# Patient Record
Sex: Female | Born: 1935 | Race: White | Hispanic: No | Smoking: Never smoker
Health system: Southern US, Community
[De-identification: ages and names within clinical notes are randomized; demographics above are authoritative.]

## PROBLEM LIST (undated history)

## (undated) DIAGNOSIS — E039 Hypothyroidism, unspecified: Secondary | ICD-10-CM

## (undated) DIAGNOSIS — Z950 Presence of cardiac pacemaker: Secondary | ICD-10-CM

## (undated) DIAGNOSIS — I2510 Atherosclerotic heart disease of native coronary artery without angina pectoris: Secondary | ICD-10-CM

## (undated) DIAGNOSIS — G4700 Insomnia, unspecified: Secondary | ICD-10-CM

## (undated) DIAGNOSIS — E079 Disorder of thyroid, unspecified: Secondary | ICD-10-CM

## (undated) DIAGNOSIS — I10 Essential (primary) hypertension: Secondary | ICD-10-CM

## (undated) HISTORY — PX: ABDOMINAL HYSTERECTOMY: SHX81

## (undated) HISTORY — PX: OTHER SURGICAL HISTORY: SHX169

## (undated) HISTORY — DX: Insomnia, unspecified: G47.00

---

## 2013-11-15 ENCOUNTER — Encounter (HOSPITAL_BASED_OUTPATIENT_CLINIC_OR_DEPARTMENT_OTHER): Payer: Self-pay | Admitting: *Deleted

## 2013-11-15 ENCOUNTER — Emergency Department (HOSPITAL_BASED_OUTPATIENT_CLINIC_OR_DEPARTMENT_OTHER)
Admission: EM | Admit: 2013-11-15 | Discharge: 2013-11-15 | Disposition: A | Discharge status: Home / Self Care | Payer: Medicare Other | Attending: Emergency Medicine | Admitting: Emergency Medicine

## 2013-11-15 DIAGNOSIS — Z8781 Personal history of (healed) traumatic fracture: Secondary | ICD-10-CM | POA: Insufficient documentation

## 2013-11-15 DIAGNOSIS — E079 Disorder of thyroid, unspecified: Secondary | ICD-10-CM | POA: Diagnosis not present

## 2013-11-15 DIAGNOSIS — I2510 Atherosclerotic heart disease of native coronary artery without angina pectoris: Secondary | ICD-10-CM | POA: Insufficient documentation

## 2013-11-15 DIAGNOSIS — Z950 Presence of cardiac pacemaker: Secondary | ICD-10-CM | POA: Diagnosis not present

## 2013-11-15 DIAGNOSIS — S29012D Strain of muscle and tendon of back wall of thorax, subsequent encounter: Secondary | ICD-10-CM | POA: Diagnosis not present

## 2013-11-15 DIAGNOSIS — S199XXD Unspecified injury of neck, subsequent encounter: Secondary | ICD-10-CM | POA: Diagnosis present

## 2013-11-15 DIAGNOSIS — I10 Essential (primary) hypertension: Secondary | ICD-10-CM | POA: Insufficient documentation

## 2013-11-15 DIAGNOSIS — Z7901 Long term (current) use of anticoagulants: Secondary | ICD-10-CM | POA: Insufficient documentation

## 2013-11-15 DIAGNOSIS — Z79899 Other long term (current) drug therapy: Secondary | ICD-10-CM | POA: Insufficient documentation

## 2013-11-15 DIAGNOSIS — Z7951 Long term (current) use of inhaled steroids: Secondary | ICD-10-CM | POA: Insufficient documentation

## 2013-11-15 DIAGNOSIS — S46819D Strain of other muscles, fascia and tendons at shoulder and upper arm level, unspecified arm, subsequent encounter: Secondary | ICD-10-CM

## 2013-11-15 HISTORY — DX: Atherosclerotic heart disease of native coronary artery without angina pectoris: I25.10

## 2013-11-15 HISTORY — DX: Disorder of thyroid, unspecified: E07.9

## 2013-11-15 HISTORY — DX: Essential (primary) hypertension: I10

## 2013-11-15 MED ORDER — ORPHENADRINE CITRATE ER 100 MG PO TB12: 100.0000 mg | ORAL_TABLET | Freq: Two times a day (BID) | ORAL | Status: DC | PRN
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
## 2013-11-15 NOTE — Discharge Instructions (Signed)
Use heat, gradual increase stretching and follow-up with physical therapy for further evaluation and treatment of your neck and upper back musculature specifically trapezius muscles.  If you were given medicines take as directed.  If you are on coumadin or contraceptives realize their levels and effectiveness is altered by many different medicines.  If you have any reaction (rash, tongues swelling, other) to the medicines stop taking and see a physician.   Please follow up as directed and return to the ER or see a physician for new or worsening symptoms.  Thank you. Filed Vitals:   11/15/13 1644  BP: 153/55  Pulse: 84  Temp: 97.9 F (36.6 C)  TempSrc: Oral  Resp: 18  Height: 5\' 2"  (1.575 m)  Weight: 125 lb (56.7 kg)  SpO2: 100%

## 2013-11-15 NOTE — ED Notes (Signed)
MD at bedside. 

## 2013-11-15 NOTE — ED Notes (Signed)
Directed to pharmacy to pick up Rx- referral given for PT
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
## 2015-01-18 DIAGNOSIS — Z95810 Presence of automatic (implantable) cardiac defibrillator: Secondary | ICD-10-CM | POA: Insufficient documentation

## 2015-09-27 ENCOUNTER — Emergency Department (HOSPITAL_BASED_OUTPATIENT_CLINIC_OR_DEPARTMENT_OTHER)
Admission: EM | Admit: 2015-09-27 | Discharge: 2015-09-28 | Disposition: A | Discharge status: Home / Self Care | Payer: Medicare Other | Attending: Emergency Medicine | Admitting: Emergency Medicine

## 2015-09-27 ENCOUNTER — Emergency Department (HOSPITAL_BASED_OUTPATIENT_CLINIC_OR_DEPARTMENT_OTHER): Payer: Medicare Other

## 2015-09-27 ENCOUNTER — Encounter (HOSPITAL_BASED_OUTPATIENT_CLINIC_OR_DEPARTMENT_OTHER): Payer: Self-pay | Admitting: Emergency Medicine

## 2015-09-27 DIAGNOSIS — M542 Cervicalgia: Secondary | ICD-10-CM | POA: Diagnosis not present

## 2015-09-27 DIAGNOSIS — Z79899 Other long term (current) drug therapy: Secondary | ICD-10-CM | POA: Diagnosis not present

## 2015-09-27 DIAGNOSIS — I2510 Atherosclerotic heart disease of native coronary artery without angina pectoris: Secondary | ICD-10-CM | POA: Insufficient documentation

## 2015-09-27 DIAGNOSIS — M5442 Lumbago with sciatica, left side: Secondary | ICD-10-CM | POA: Insufficient documentation

## 2015-09-27 DIAGNOSIS — I10 Essential (primary) hypertension: Secondary | ICD-10-CM | POA: Insufficient documentation

## 2015-09-27 DIAGNOSIS — M545 Low back pain: Secondary | ICD-10-CM | POA: Diagnosis present

## 2015-09-27 DIAGNOSIS — W19XXXA Unspecified fall, initial encounter: Secondary | ICD-10-CM

## 2015-09-27 MED ORDER — ACETAMINOPHEN 325 MG PO TABS
650.0000 mg | ORAL_TABLET | Freq: Once | ORAL | Status: AC
  Administered 2015-09-27: 650 mg via ORAL
  Filled 2015-09-27: qty 2

## 2015-09-27 NOTE — ED Notes (Signed)
Pt to xray by w/c.

## 2015-09-27 NOTE — ED Notes (Addendum)
Pt alert, NAD, calm, interactive, resps e/u, speaking in clear complete sentences, no dyspnea noted, MAEx4,  reports back and neck pain. [facility]

## 2015-09-27 NOTE — ED Provider Notes (Signed)
[facility] Provider Note   CSN: [id number] Arrival date & time: 09/27/15  1931  By signing my name below, I, Freida Busman, attest that this documentation has been prepared under the direction and in the presence of non-physician practitioner, Everlene Farrier, PA-C. Electronically Signed: Freida Busman, Scribe. 09/27/2015. 1:38 AM.  History   Chief Complaint Chief Complaint  Patient presents with  . Fall   The history is provided by the patient. No language interpreter was used.   HPI Comments:  Patricia Bond is a 81 y.o. female who presents to the Emergency Department s/p fall 1 week ago complaining of gradually worsening lower back pain following the incident. She feels her pain radiates into her left hip. She rates her pain a 5/10. Pt sat on the edge of a chair that moved; she fell backwards and landed on tile ground. Pt struck the back of her head on the chair but denies LOC. She reports associated right lateral neck pain. She denies Fevers, numbness, tingling, weakness, double vision, ear discharge, HA, CP, and SOB.  Pt is currently on Eliquis. Pt notes she has hardware in the right hip.   Past Medical History:  Diagnosis Date  . Coronary artery disease   . Hypertension   . Thyroid disease     There are no active problems to display for this patient.   Past Surgical History:  Procedure Laterality Date  . ABDOMINAL HYSTERECTOMY    . cardiac ablasion    . pace maker      OB History    No data available       Home Medications    Prior to Admission medications   Medication Sig Start Date End Date Taking? Authorizing Provider  apixaban (ELIQUIS) 5 MG TABS tablet Take 2.5 mg by mouth 2 (two) times daily.    Yes Historical Provider, MD  acetaminophen (TYLENOL) 500 MG tablet Take 1 tablet (500 mg total) by mouth every 6 (six) hours as needed for mild pain or moderate pain. 09/28/15   Everlene Farrier, PA-C  carvedilol (COREG) 6.25 MG tablet Take 6.25 mg by mouth  2 (two) times daily with a meal.    Historical Provider, MD  DULoxetine (CYMBALTA) 30 MG capsule Take 30 mg by mouth daily.    Historical Provider, MD  fluticasone (VERAMYST) 27.5 MCG/SPRAY nasal spray Place 2 sprays into the nose daily.    Historical Provider, MD  Levothyroxine Sodium (SYNTHROID PO) Take by mouth.    Historical Provider, MD  lisinopril (PRINIVIL,ZESTRIL) 5 MG tablet Take 5 mg by mouth daily.    Historical Provider, MD  LORAZEPAM PO Take by mouth.    Historical Provider, MD  methocarbamol (ROBAXIN) 500 MG tablet Take 1 tablet (500 mg total) by mouth 2 (two) times daily as needed for muscle spasms. 09/28/15   Everlene Farrier, PA-C  omeprazole (PRILOSEC) 20 MG capsule Take 20 mg by mouth daily.    Historical Provider, MD  orphenadrine (NORFLEX) 100 MG tablet Take 1 tablet (100 mg total) by mouth 2 (two) times daily as needed for muscle spasms. 11/15/13   Blane Ohara, MD  ranolazine (RANEXA) 500 MG 12 hr tablet Take 500 mg by mouth 2 (two) times daily.    Historical Provider, MD  rosuvastatin (CRESTOR) 40 MG tablet Take 40 mg by mouth daily.    Historical Provider, MD  warfarin (COUMADIN) 2.5 MG tablet Take 2.5 mg by mouth daily.    Historical Provider, MD    Family History History  reviewed. No pertinent family history.  Social History Social History  Substance Use Topics  . Smoking status: Never Smoker  . Smokeless tobacco: Never Used  . Alcohol use No     Allergies   Ivp dye [iodinated diagnostic agents] and Sulfa antibiotics   Review of Systems Review of Systems  Constitutional: Negative for fever.  HENT: Negative for ear discharge.   Eyes: Negative for visual disturbance.  Respiratory: Negative for shortness of breath.   Cardiovascular: Negative for chest pain.  Gastrointestinal: Negative for abdominal pain, nausea and vomiting.  Genitourinary: Negative for difficulty urinating and dysuria.  Musculoskeletal: Positive for back pain and neck pain.  Skin:  Negative for rash and wound.  Neurological: Negative for dizziness, syncope, weakness, light-headedness, numbness and headaches.    Physical Exam Updated Vital Signs BP 137/61 (BP Location: Right Arm)   Pulse 78   Temp 98.8 F (37.1 C) (Oral)   Resp 18   Ht 5\' 2"  (1.575 m)   Wt 62.6 kg   SpO2 100%   BMI 25.24 kg/m   Physical Exam  Constitutional: She is oriented to person, place, and time. She appears well-developed and well-nourished. No distress.  Non-toxic appearing  HENT:  Head: Normocephalic and atraumatic.  Right Ear: External ear normal.  Left Ear: External ear normal.  Mouth/Throat: Oropharynx is clear and moist.  No visble signs of head trauma   Eyes: Conjunctivae and EOM are normal. Pupils are equal, round, and reactive to light. Right eye exhibits no discharge. Left eye exhibits no discharge.  Neck: Normal range of motion. Neck supple. No JVD present. No tracheal deviation present.  Right lateral neck tenderness; No midline cervical tenderness.  No crepitus or deformity  Cardiovascular: Normal rate, regular rhythm, normal heart sounds and intact distal pulses.   Pulses:      Radial pulses are 2+ on the right side, and 2+ on the left side.       Dorsalis pedis pulses are 2+ on the right side, and 2+ on the left side.  Pulmonary/Chest: Effort normal and breath sounds normal. No stridor. No respiratory distress. She has no wheezes. She has no rales. She exhibits no tenderness.  Lungs CTA bilaterally Symmetric chest expansion bilaterally  Abdominal: Soft. There is no tenderness. There is no guarding.  Musculoskeletal: Normal range of motion. She exhibits tenderness. She exhibits no edema or deformity.  Tenderness across bilateral low back; no midline back tenderness Good strength to BUE and BLE  Mild TTP to left lateral hip No midline neck or back tenderness. No back erythema, deformity, ecchymosis or warmth.  Lymphadenopathy:    She has no cervical adenopathy.    Neurological: She is alert and oriented to person, place, and time. No cranial nerve deficit. Coordination normal.  Cranial nerves intact  Speech clear and coherent Sensation intact to BLE and BUE  Skin: Skin is warm and dry. Capillary refill takes less than 2 seconds. No rash noted. She is not diaphoretic.  Psychiatric: She has a normal mood and affect. Her behavior is normal.  Nursing note and vitals reviewed.    ED Treatments / Results  DIAGNOSTIC STUDIES:  Oxygen Saturation is 100% on RA, normal by my interpretation.    COORDINATION OF CARE:  9:37 PM Discussed treatment plan with pt at bedside and pt agreed to plan.  Labs (all labs ordered are listed, but only abnormal results are displayed) Labs Reviewed - No data to display  EKG  EKG Interpretation None  Radiology No results found.  Procedures Procedures (including critical care time)  Medications Ordered in ED Medications  acetaminophen (TYLENOL) tablet 650 mg (650 mg Oral Given 09/27/15 2156)     Initial Impression / Assessment and Plan / ED Course  I have reviewed the triage vital signs and the nursing notes.  Pertinent labs & imaging results that were available during my care of the patient were reviewed by me and considered in my medical decision making (see chart for details).  Clinical Course   Patient presented to the emergency department after a slip and fall out of her chair one week ago. She is complaining of bilateral low back pain that can radiate to her left hip as well as right lateral neck pain. She denies loss of consciousness. She denies headache. She reports pain worse with walking. On exam the patient is afebrile nontoxic appearing. She has no focal neurological deficits. She has good strength to her bilateral upper and lower extremities. No midline neck or back tenderness. She does have tenderness across her bilateral lumbar spine. She is has mild tenderness to her left lateral  hip. X-rays were obtained of her cervical spine, lumbar spine and left hip with pelvis. There is some difficulty obtaining the radiology report on these results due to unexpected radiology downtime. I called and spoke with the radiologist who read the images. No evidence of acute fracture or dislocation on her films. She has degenerative changes. Her hip hardware is intact. Her left hip. I discussed these results with the patient. Patient is then able to ambulate in the room with minimal pain. This is reassuring. We will have her continue Tylenol as needed for pain and do a short course of Robaxin. I advised that she needs to be careful with her Robaxin as this can make her drowsy and loopy. Her daughter will be staying with her and will help her. I advised the patient to follow-up with their primary care provider this week. I advised the patient to return to the emergency department with new or worsening symptoms or new concerns. The patient verbalized understanding and agreement with plan.    Final Clinical Impressions(s) / ED Diagnoses   Final diagnoses:  Fall, initial encounter  Neck pain  Left-sided low back pain with left-sided sciatica    New Prescriptions New Prescriptions   ACETAMINOPHEN (TYLENOL) 500 MG TABLET    Take 1 tablet (500 mg total) by mouth every 6 (six) hours as needed for mild pain or moderate pain.   METHOCARBAMOL (ROBAXIN) 500 MG TABLET    Take 1 tablet (500 mg total) by mouth 2 (two) times daily as needed for muscle spasms.   I personally performed the services described in this documentation, which was scribed in my presence. The recorded information has been reviewed and is accurate.      Everlene FarrierWilliam Shakya Sebring, PA-C 09/28/15 [id number]    Benjiman CoreNathan Pickering, MD 09/29/15 [phone]

## 2015-09-27 NOTE — ED Notes (Signed)
Sitting in w/c for comfort, given pillow, and sips of water, pending xray results, NAD, calm.

## 2015-09-27 NOTE — ED Triage Notes (Signed)
Patient states that she fell about 1 week and her lower back is still bother her and her neck

## 2015-09-28 MED ORDER — ACETAMINOPHEN 500 MG PO TABS: 500.0000 mg | ORAL_TABLET | Freq: Four times a day (QID) | ORAL | 0 refills | Status: DC | PRN

## 2015-09-28 MED ORDER — METHOCARBAMOL 500 MG PO TABS: 500.0000 mg | ORAL_TABLET | Freq: Two times a day (BID) | ORAL | 0 refills | Status: DC | PRN

## 2015-12-26 ENCOUNTER — Emergency Department (HOSPITAL_BASED_OUTPATIENT_CLINIC_OR_DEPARTMENT_OTHER): Payer: Medicare Other

## 2015-12-26 ENCOUNTER — Emergency Department (HOSPITAL_BASED_OUTPATIENT_CLINIC_OR_DEPARTMENT_OTHER)
Admission: EM | Admit: 2015-12-26 | Discharge: 2015-12-26 | Disposition: A | Discharge status: Home / Self Care | Payer: Medicare Other | Attending: Physician Assistant | Admitting: Physician Assistant

## 2015-12-26 ENCOUNTER — Encounter (HOSPITAL_BASED_OUTPATIENT_CLINIC_OR_DEPARTMENT_OTHER): Payer: Self-pay | Admitting: Emergency Medicine

## 2015-12-26 DIAGNOSIS — E039 Hypothyroidism, unspecified: Secondary | ICD-10-CM | POA: Insufficient documentation

## 2015-12-26 DIAGNOSIS — R1031 Right lower quadrant pain: Secondary | ICD-10-CM | POA: Diagnosis present

## 2015-12-26 DIAGNOSIS — I10 Essential (primary) hypertension: Secondary | ICD-10-CM | POA: Insufficient documentation

## 2015-12-26 DIAGNOSIS — R112 Nausea with vomiting, unspecified: Secondary | ICD-10-CM | POA: Diagnosis not present

## 2015-12-26 DIAGNOSIS — Z79899 Other long term (current) drug therapy: Secondary | ICD-10-CM | POA: Diagnosis not present

## 2015-12-26 DIAGNOSIS — R748 Abnormal levels of other serum enzymes: Secondary | ICD-10-CM

## 2015-12-26 DIAGNOSIS — R945 Abnormal results of liver function studies: Secondary | ICD-10-CM | POA: Diagnosis not present

## 2015-12-26 DIAGNOSIS — K5900 Constipation, unspecified: Secondary | ICD-10-CM | POA: Diagnosis not present

## 2015-12-26 DIAGNOSIS — M545 Low back pain: Secondary | ICD-10-CM | POA: Insufficient documentation

## 2015-12-26 DIAGNOSIS — R109 Unspecified abdominal pain: Secondary | ICD-10-CM

## 2015-12-26 DIAGNOSIS — I2510 Atherosclerotic heart disease of native coronary artery without angina pectoris: Secondary | ICD-10-CM | POA: Diagnosis not present

## 2015-12-26 HISTORY — DX: Presence of cardiac pacemaker: Z95.0

## 2015-12-26 HISTORY — DX: Hypothyroidism, unspecified: E03.9

## 2015-12-26 LAB — CBC WITH DIFFERENTIAL/PLATELET
BASOS PCT: 0 %
Basophils Absolute: 0 10*3/uL (ref 0.0–0.1)
Eosinophils Absolute: 0.1 10*3/uL (ref 0.0–0.7)
Eosinophils Relative: 1 %
HEMATOCRIT: 42.2 % (ref 36.0–46.0)
Hemoglobin: 13.8 g/dL (ref 12.0–15.0)
LYMPHS ABS: 1.8 10*3/uL (ref 0.7–4.0)
Lymphocytes Relative: 27 %
MCH: 32.5 pg (ref 26.0–34.0)
MCHC: 32.7 g/dL (ref 30.0–36.0)
MCV: 99.3 fL (ref 78.0–100.0)
MONO ABS: 1.1 10*3/uL — AB (ref 0.1–1.0)
MONOS PCT: 16 %
NEUTROS ABS: 3.6 10*3/uL (ref 1.7–7.7)
Neutrophils Relative %: 56 %
Platelets: 198 10*3/uL (ref 150–400)
RBC: 4.25 MIL/uL (ref 3.87–5.11)
RDW: 14.7 % (ref 11.5–15.5)
WBC: 6.5 10*3/uL (ref 4.0–10.5)

## 2015-12-26 LAB — COMPREHENSIVE METABOLIC PANEL
ALK PHOS: 74 U/L (ref 38–126)
ALT: 254 U/L — ABNORMAL HIGH (ref 14–54)
AST: 315 U/L — ABNORMAL HIGH (ref 15–41)
Albumin: 3.2 g/dL — ABNORMAL LOW (ref 3.5–5.0)
Anion gap: 6 (ref 5–15)
BILIRUBIN TOTAL: 1.1 mg/dL (ref 0.3–1.2)
BUN: 14 mg/dL (ref 6–20)
CO2: 28 mmol/L (ref 22–32)
Calcium: 8.4 mg/dL — ABNORMAL LOW (ref 8.9–10.3)
Chloride: 102 mmol/L (ref 101–111)
Creatinine, Ser: 0.99 mg/dL (ref 0.44–1.00)
GFR calc Af Amer: >60 mL/min (ref >60)
GFR calc non Af Amer: 53 mL/min — ABNORMAL LOW (ref >60)
Glucose, Bld: 97 mg/dL (ref 65–99)
Potassium: 3.5 mmol/L (ref 3.5–5.1)
Sodium: 136 mmol/L (ref 135–145)
TOTAL PROTEIN: 6.4 g/dL — AB (ref 6.5–8.1)

## 2015-12-26 LAB — URINALYSIS, MICROSCOPIC (REFLEX)

## 2015-12-26 LAB — URINALYSIS, ROUTINE W REFLEX MICROSCOPIC
BILIRUBIN URINE: NEGATIVE
Glucose, UA: NEGATIVE mg/dL
KETONES UR: NEGATIVE mg/dL
Nitrite: NEGATIVE
PH: 7 (ref 5.0–8.0)
Protein, ur: NEGATIVE mg/dL
Specific Gravity, Urine: 1.005 (ref 1.005–1.030)

## 2015-12-26 LAB — LIPASE, BLOOD: Lipase: 15 U/L (ref 11–51)

## 2015-12-26 MED ORDER — ONDANSETRON HCL 4 MG/2ML IJ SOLN: 4.0000 mg | Freq: Once | INTRAMUSCULAR | Status: DC

## 2015-12-26 MED ORDER — METOCLOPRAMIDE HCL 5 MG/ML IJ SOLN
10.0000 mg | Freq: Once | INTRAMUSCULAR | Status: AC
  Administered 2015-12-26: 10 mg via INTRAVENOUS
  Filled 2015-12-26: qty 2

## 2015-12-26 MED ORDER — METOCLOPRAMIDE HCL 10 MG PO TABS: 10.0000 mg | ORAL_TABLET | Freq: Four times a day (QID) | ORAL | 0 refills | Status: DC

## 2015-12-26 NOTE — ED Notes (Signed)
Patient transported to Ultrasound 

## 2015-12-26 NOTE — ED Provider Notes (Signed)
[facility] Provider Note   CSN: [id number] Arrival date & time: 12/26/15  1718  By signing my name below, I, Patricia Bond, attest that this documentation has been prepared under the direction and in the presence Cheri Fowler, PA-C. Electronically Signed: Linna Bond, Scribe. 12/26/2015. 5:47 PM.  History   Chief Complaint Chief Complaint  Patient presents with  . Abdominal Pain    The history is provided by the patient. No language interpreter was used.    HPI Comments: Patricia Bond is a 81 y.o. female who presents to the Emergency Department complaining of constant, worsening, bilateral lower abdominal pain for the last month. She states the pain has been sharp and dull at times. She states her abdominal pain is better with certain positions and notes no exacerbating factors. She reports associated generalized weakness and fatigue; she notes difficulty ambulating and exerting herself due to these symptoms. She reports lower back pain L > R, constipation, urinary frequency, and decreased appetite since onset as well. Pt reports she started experiencing nausea and vomiting last night; she called her PCP today to discuss these symptoms and was advised to come to the ER. She reports she had blood work taken by her PCP recently and it was normal. She has an abdominal ultrasound scheduled for tomorrow at her PCP's office. She notes a PSHx of abdominal hysterectomy. Pt is not on any narcotics. She denies fever, chills, hematochezia, dysuria, hematuria, hematemesis, diarrhea, or any other associated symptoms.  Past Medical History:  Diagnosis Date  . Coronary artery disease   . Hypertension   . Hypothyroidism   . Pacemaker   . Thyroid disease     There are no active problems to display for this patient.   Past Surgical History:  Procedure Laterality Date  . ABDOMINAL HYSTERECTOMY    . cardiac ablasion    . pace maker      OB History    No data available        Home Medications    Prior to Admission medications   Medication Sig Start Date End Date Taking? Authorizing Provider  amiodarone (PACERONE) 100 MG tablet Take 100 mg by mouth daily.   Yes Historical Provider, MD  apixaban (ELIQUIS) 5 MG TABS tablet Take 2.5 mg by mouth 2 (two) times daily.    Yes Historical Provider, MD  DULoxetine (CYMBALTA) 30 MG capsule Take 30 mg by mouth daily.   Yes Historical Provider, MD  Levothyroxine Sodium (SYNTHROID PO) Take by mouth.   Yes Historical Provider, MD  lisinopril (PRINIVIL,ZESTRIL) 5 MG tablet Take 5 mg by mouth daily.   Yes Historical Provider, MD  LORAZEPAM PO Take by mouth.   Yes Historical Provider, MD  omeprazole (PRILOSEC) 20 MG capsule Take 20 mg by mouth daily.   Yes Historical Provider, MD  metoCLOPramide (REGLAN) 10 MG tablet Take 1 tablet (10 mg total) by mouth every 6 (six) hours. 12/26/15   Cheri Fowler, PA-C    Family History No family history on file.  Social History Social History  Substance Use Topics  . Smoking status: Never Smoker  . Smokeless tobacco: Never Used  . Alcohol use No     Allergies   Codeine; Ivp dye [iodinated diagnostic agents]; and Sulfa antibiotics   Review of Systems Review of Systems  Constitutional: Positive for appetite change (decreased) and fatigue. Negative for chills and fever.  Gastrointestinal: Positive for abdominal pain, constipation, nausea and vomiting. Negative for blood in stool and diarrhea.  Negative for hematemesis.  Genitourinary: Positive for frequency. Negative for dysuria and hematuria.  Musculoskeletal: Positive for back pain (lower, L > R).  Neurological: Positive for weakness.  All other systems reviewed and are negative.    Physical Exam Updated Vital Signs BP 180/65 (BP Location: Right Arm)   Pulse 75   Temp 98.2 F (36.8 C) (Oral)   Resp 18   Ht 5\' 1"  (1.549 m)   Wt 61.2 kg   SpO2 95%   BMI 25.51 kg/m   Physical Exam  Constitutional: She is  oriented to person, place, and time. She appears well-developed and well-nourished.  Non-toxic appearance. She does not have a sickly appearance. She does not appear ill.  HENT:  Head: Normocephalic and atraumatic.  Mouth/Throat: Oropharynx is clear and moist.  Eyes: Conjunctivae are normal.  Neck: Normal range of motion. Neck supple.  Cardiovascular: Normal rate and regular rhythm.   Pulmonary/Chest: Effort normal and breath sounds normal. No accessory muscle usage or stridor. No respiratory distress. She has no wheezes. She has no rhonchi. She has no rales.  Abdominal: Soft. Bowel sounds are normal. She exhibits no distension. There is no tenderness.  Musculoskeletal: Normal range of motion.  Lymphadenopathy:    She has no cervical adenopathy.  Neurological: She is alert and oriented to person, place, and time.  Speech clear without dysarthria.  Skin: Skin is warm and dry.  Psychiatric: She has a normal mood and affect. Her behavior is normal.     ED Treatments / Results  Labs (all labs ordered are listed, but only abnormal results are displayed) Labs Reviewed  COMPREHENSIVE METABOLIC PANEL - Abnormal; Notable for the following:       Result Value   Calcium 8.4 (*)    Total Protein 6.4 (*)    Albumin 3.2 (*)    AST 315 (*)    ALT 254 (*)    GFR calc non Af Amer 53 (*)    All other components within normal limits  CBC WITH DIFFERENTIAL/PLATELET - Abnormal; Notable for the following:    Monocytes Absolute 1.1 (*)    All other components within normal limits  URINALYSIS, ROUTINE W REFLEX MICROSCOPIC - Abnormal; Notable for the following:    Hgb urine dipstick SMALL (*)    Leukocytes, UA MODERATE (*)    All other components within normal limits  URINALYSIS, MICROSCOPIC (REFLEX) - Abnormal; Notable for the following:    Bacteria, UA FEW (*)    Squamous Epithelial / LPF 0-5 (*)    All other components within normal limits  URINE CULTURE  LIPASE, BLOOD  HEPATITIS PANEL, ACUTE     EKG  EKG Interpretation None       Radiology [place] Abdomen Limited Ruq  Result Date: 12/26/2015 CLINICAL DATA:  Right upper quadrant pain EXAM: US ABDOMEN LIMITED - RIGHT UPPER QUADRANT COMPARISON:  None. FINDINGS: Examination is limited by overlying bowel gas. Gallbladder: No gallstones or wall thickening visualized. No sonographic Murphy sign noted by sonographer. Common bile duct: Diameter: 2.4 mm Liver: No focal lesion identified. Within normal limits in parenchymal echogenicity. IMPRESSION: Normal right upper quadrant ultrasound. Electronically Signed   By: Deatra RobinsonKevin  Herman M.D.   On: 12/26/2015 19:18    Procedures Procedures (including critical care time)  DIAGNOSTIC STUDIES: Oxygen Saturation is 100% on RA, normal by my interpretation.    COORDINATION OF CARE: 5:57 PM Discussed treatment plan with pt at bedside and pt agreed to plan.  Medications Ordered in ED Medications  metoCLOPramide (REGLAN) injection 10 mg (10 mg Intravenous Given 12/26/15 1837)     Initial Impression / Assessment and Plan / ED Course  I have reviewed the triage vital signs and the nursing notes.  Pertinent labs & imaging results that were available during my care of the patient were reviewed by me and considered in my medical decision making (see chart for details).  Clinical Course    Patient presents with 1 month history of ongoing abdominal pain for which she is followed by her PCP.  Vitals reassuring.  Abdomen is soft and benign without focal tenderness, rebound, guarding, or rigidity.  Labs obtained and revealed elevated AST and ALT enzymes.  Therefore, RUQ ultrasound obtained which was negative.  She felt improved after reglan and was able to tolerate PO without difficulty.  She continually asked for pain medication; however, I discussed with her that narcotics are not indicated at this time as she is already taking tramadol.  Discussed follow up with PCP for pain management.  Acute hepatitis  panel pending.  Suspect elevations secondary to amiodarone and instructed patient to call her cardiologist tomorrow. She will call her PCP tomorrow regarding these results as well for further outpatient management.  Return precautions discussed.  Stable for discharge.  Case has been discussed with and seen by Dr. Corlis LeakMackuen who agrees with the above plan for discharge.   Final Clinical Impressions(s) / ED Diagnoses   Final diagnoses:  Elevated liver enzymes  Abdominal pain, unspecified abdominal location    New Prescriptions Discharge Medication List as of 12/26/2015  8:43 PM    START taking these medications   Details  metoCLOPramide (REGLAN) 10 MG tablet Take 1 tablet (10 mg total) by mouth every 6 (six) hours., Starting Wed 12/26/2015, Print       I personally performed the services described in this documentation, which was scribed in my presence. The recorded information has been reviewed and is accurate.    Cheri FowlerKayla Eleonor Ocon, PA-C 12/27/15 0033    Courteney Randall AnLyn Mackuen, MD 12/27/15 [id number]

## 2015-12-26 NOTE — ED Triage Notes (Addendum)
Pt has been having lower abdominal pain for 2 weeks.  Seen by PCP but no diagnosis yet.  Some Nausea for three days.  vomited today.  No fever.  No diarrhea.  Last bm three days ago.

## 2015-12-26 NOTE — Discharge Instructions (Addendum)
Your liver enzymes were elevated today, AST 315 ALT 254.  We have sent an acute hepatitis panel as well that your primary care physician may follow up on the results.  Your right upper quadrant ultrasound today was normal.  I suspect this elevation is coming from your amiodarone.  Call your cardiologist tomorrow regarding this.  Start taking Reglan every 6 hours for nausea.  Continue taking your tramadol as prescribed.  Follow up with your primary care physician regarding your pain control.

## 2015-12-26 NOTE — ED Notes (Signed)
ED Provider at bedside. 

## 2015-12-28 LAB — URINE CULTURE

## 2015-12-28 LAB — HEPATITIS PANEL, ACUTE
HCV Ab: <0.1 s/co ratio (ref 0.0–0.9)
HEP A IGM: NEGATIVE
HEP B C IGM: NEGATIVE
Hepatitis B Surface Ag: NEGATIVE

## 2016-01-04 DIAGNOSIS — H8112 Benign paroxysmal vertigo, left ear: Secondary | ICD-10-CM | POA: Insufficient documentation

## 2016-09-24 DIAGNOSIS — G44209 Tension-type headache, unspecified, not intractable: Secondary | ICD-10-CM | POA: Insufficient documentation

## 2017-08-03 DIAGNOSIS — Z8679 Personal history of other diseases of the circulatory system: Secondary | ICD-10-CM | POA: Insufficient documentation

## 2017-08-12 DIAGNOSIS — R839 Unspecified abnormal finding in cerebrospinal fluid: Secondary | ICD-10-CM | POA: Insufficient documentation

## 2017-08-14 ENCOUNTER — Non-Acute Institutional Stay (SKILLED_NURSING_FACILITY): Payer: Medicare Other | Admitting: Internal Medicine

## 2017-08-14 ENCOUNTER — Encounter: Payer: Self-pay | Admitting: Internal Medicine

## 2017-08-14 DIAGNOSIS — Z95810 Presence of automatic (implantable) cardiac defibrillator: Secondary | ICD-10-CM

## 2017-08-14 DIAGNOSIS — E079 Disorder of thyroid, unspecified: Secondary | ICD-10-CM

## 2017-08-14 DIAGNOSIS — G44209 Tension-type headache, unspecified, not intractable: Secondary | ICD-10-CM

## 2017-08-14 DIAGNOSIS — R839 Unspecified abnormal finding in cerebrospinal fluid: Secondary | ICD-10-CM | POA: Diagnosis not present

## 2017-08-14 DIAGNOSIS — G030 Nonpyogenic meningitis: Secondary | ICD-10-CM

## 2017-08-14 DIAGNOSIS — Z8679 Personal history of other diseases of the circulatory system: Secondary | ICD-10-CM | POA: Diagnosis not present

## 2017-08-14 DIAGNOSIS — I10 Essential (primary) hypertension: Secondary | ICD-10-CM

## 2017-08-14 DIAGNOSIS — H8112 Benign paroxysmal vertigo, left ear: Secondary | ICD-10-CM

## 2017-08-14 DIAGNOSIS — R42 Dizziness and giddiness: Secondary | ICD-10-CM

## 2017-08-14 NOTE — Progress Notes (Signed)
:  Location:  [facility] Room Number: [phone] Place of Service:  SNF ([id number])  Patricia Bond. Lyn Hollingshead, MD  Patient Care Team: Forrest Moron, MD as PCP - General (Internal Medicine)  Extended Emergency Contact Information Primary Emergency Contact: [facility] Address: [address]          [place], [place] [address] [place] Home Phone: [phone] Relation: Daughter     Allergies: Codeine; Ivp dye [iodinated diagnostic agents]; and Sulfa antibiotics  Chief Complaint  Patient presents with  . New Admit To SNF    Admit to [facility]    HPI: Patient is 83 y.o. female with multiple comorbidities who presented to [facility] ED with complaint of severe constant occipital headache associated with nausea and vomiting ongoing for 3 days.  Headache is reported to be 10/10 intensity throbbing with no aggravating or relieving factors.  Patient denies any head trauma.  Patient has a history of tension headaches for which she sees Dr. Javier Docker and took sumatriptan without relief.  She denied any focal weakness numbness or tingling.  She denied any lightheadedness or speech difficulty she also denied visual disturbances or prior history of CVA.  Patient does have a history of vertigo and has had some associated dizziness.  Patient was hypertensive when she came into the ED and was given IV labetalol and her current home systolic blood pressure is less than 100 patient was admitted to [facility] from 7/29-8/8 where she was worked up for headache.  There was concern for mental meningitis and a lumbar puncture was performed which showed a white count of 115 with lymphocytes predominating.  Protein was elevated and glucose was low.  Cysts after cultures were negative as well as serological testing.  CSF cultures may have been negative because antibiotics were started before the sample was obtained.  Patient improved on broad-spectrum  antibiotics and antiviral therapy ID recommended she continue vancomycin and Rocephin through 08/19/2017.  To that in a PICC line was placed on 8/7.  Patient is unstable her feet.  Patient has atrial fibrillation for which she takes anticoagulation but per cardiology we will hold off on anticoagulation until her balance and fall risk are improved.  Patient will still take diltiazem and Coreg for rate control.  Patient does have an ICD in place patient is admitted to skilled nursing facility for OT/PT.  While at skilled nursing facility patient will be treated for atrial fib with Coreg and diltiazem for rate and aspirin only as prophylaxis, for blood pressure treated with Coreg, Zestril, and Cardizem, and hypothyroidism treated with Synthroid.   Past Medical History:  Diagnosis Date  . Coronary artery disease   . Hypertension   . Hypothyroidism   . Insomnia   . Pacemaker   . Thyroid disease     Past Surgical History:  Procedure Laterality Date  . ABDOMINAL HYSTERECTOMY    . cardiac ablasion    . pace maker      Allergies as of 08/14/2017      Reactions   Codeine    Ivp Dye [iodinated Diagnostic Agents]    rash   Sulfa Antibiotics    rash      Medication List        Accurate as of 08/14/17 11:18 AM. Always use your most recent med list.          alendronate 70 MG tablet Commonly known as:  FOSAMAX Take  70 mg by mouth once a week. Take with a full glass of water on an empty stomach.   aspirin 81 MG tablet Take 81 mg by mouth daily.   carvedilol 6.25 MG tablet Commonly known as:  COREG Take 6.25 mg by mouth 2 (two) times daily with a meal.   cefTRIAXone  IVPB Commonly known as:  ROCEPHIN Inject into the vein.   diltiazem 240 MG 24 hr capsule Commonly known as:  CARDIZEM CD Take 240 mg by mouth daily.   DULoxetine 30 MG capsule Commonly known as:  CYMBALTA Take 30 mg by mouth daily.   folic acid 1 MG tablet Commonly known as:  FOLVITE Take 1 mg by mouth daily.     gabapentin 300 MG capsule Commonly known as:  NEURONTIN Take 300 mg by mouth 2 (two) times daily.   lisinopril 5 MG tablet Commonly known as:  PRINIVIL,ZESTRIL Take 5 mg by mouth daily.   LORAZEPAM PO Take 0.5 mg by mouth. 3 tablets (1.5 mg total) by mouth nightly   meclizine 25 MG tablet Commonly known as:  ANTIVERT Take 25 mg by mouth 3 (three) times daily as needed for dizziness.   omeprazole 20 MG capsule Commonly known as:  PRILOSEC Take 20 mg by mouth daily.   rosuvastatin 20 MG tablet Commonly known as:  CRESTOR Take 20 mg by mouth daily.   SUMAtriptan 100 MG tablet Commonly known as:  IMITREX Take 100 mg by mouth every 12 (twelve) hours as needed for migraine or headache. May repeat in 2 hours if headache persists or recurs.   SYNTHROID PO Take by mouth. 50 mcg by mouth daily at 0600   thiamine 100 MG tablet Take 100 mg by mouth daily.   traMADol 50 MG tablet Commonly known as:  ULTRAM Take by mouth every 6 (six) hours as needed.   vancomycin  IVPB Inject 1,000 mg into the vein every 12 (twelve) hours.       No orders of the defined types were placed in this encounter.    There is no immunization history on file for this patient.  Social History   Tobacco Use  . Smoking status: Never Smoker  . Smokeless tobacco: Never Used  Substance Use Topics  . Alcohol use: No    Family history is   Family History  Problem Relation Age of Onset  . Heart disease Mother   . Heart disease Brother       Review of Systems  DATA OBTAINED: from patient, nurse GENERAL:  no fevers, fatigue, appetite changes SKIN: No itching, or rash EYES: No eye pain, redness, discharge EARS: No earache, tinnitus, change in hearing NOSE: No congestion, drainage or bleeding  MOUTH/THROAT: No mouth or tooth pain, No sore throat RESPIRATORY: No cough, wheezing, SOB CARDIAC: No chest pain, palpitations, lower extremity edema  GI: No abdominal pain, No N/V/D or  constipation, No heartburn or reflux  GU: No dysuria, frequency or urgency, or incontinence  MUSCULOSKELETAL: No unrelieved bone/joint pain NEUROLOGIC: No headache, dizziness or focal weakness PSYCHIATRIC: No c/o anxiety or sadness   Vitals:   08/14/17 1102  BP: (!) 147/71  Pulse: 80  Resp: 16  Temp: 98.7 F (37.1 C)    SpO2 Readings from Last 1 Encounters:  12/26/15 95%   Body mass index is 25.99 kg/m.     Physical Exam  GENERAL APPEARANCE: Alert, conversant,  No acute distress.  SKIN: No diaphoresis rash HEAD: Normocephalic, atraumatic  EYES: Conjunctiva/lids clear. Pupils  round, reactive. EOMs intact.  EARS: External exam WNL, canals clear. Hearing grossly normal.  NOSE: No deformity or discharge.  MOUTH/THROAT: Lips w/o lesions  RESPIRATORY: Breathing is even, unlabored. Lung sounds are clear   CARDIOVASCULAR: Heart RRR no murmurs, rubs or gallops.  Trace peripheral edema.   GASTROINTESTINAL: Abdomen is soft, non-tender, not distended w/ normal bowel sounds. GENITOURINARY: Bladder non tender, not distended  MUSCULOSKELETAL: No abnormal joints or musculature NEUROLOGIC:  Cranial nerves 2-12 grossly intact. Moves all extremities  PSYCHIATRIC: Mood and affect appropriate to situation, no behavioral issues  There are no active problems to display for this patient.     Labs reviewed: Basic Metabolic Panel:    Component Value Date/Time   NA 136 12/26/2015 1805   K 3.5 12/26/2015 1805   CL 102 12/26/2015 1805   CO2 28 12/26/2015 1805   GLUCOSE 97 12/26/2015 1805   BUN 14 12/26/2015 1805   CREATININE 0.99 12/26/2015 1805   CALCIUM 8.4 (L) 12/26/2015 1805   PROT 6.4 (L) 12/26/2015 1805   ALBUMIN 3.2 (L) 12/26/2015 1805   AST 315 (H) 12/26/2015 1805   ALT 254 (H) 12/26/2015 1805   ALKPHOS 74 12/26/2015 1805   BILITOT 1.1 12/26/2015 1805   GFRNONAA 53 (L) 12/26/2015 1805   GFRAA >60 12/26/2015 1805    No results for input(s): NA, K, CL, CO2, GLUCOSE, BUN,  CREATININE, CALCIUM, MG, PHOS in the last 8760 hours. Liver Function Tests: No results for input(s): AST, ALT, ALKPHOS, BILITOT, PROT, ALBUMIN in the last 8760 hours. No results for input(s): LIPASE, AMYLASE in the last 8760 hours. No results for input(s): AMMONIA in the last 8760 hours. CBC: No results for input(s): WBC, NEUTROABS, HGB, HCT, MCV, PLT in the last 8760 hours. Lipid No results for input(s): CHOL, HDL, LDLCALC, TRIG in the last 8760 hours.  Cardiac Enzymes: No results for input(s): CKTOTAL, CKMB, CKMBINDEX, TROPONINI in the last 8760 hours. BNP: No results for input(s): BNP in the last 8760 hours. No results found for: MICROALBUR No results found for: HGBA1C No results found for: TSH No results found for: VITAMINB12 No results found for: FOLATE No results found for: IRON, TIBC, FERRITIN  Imaging and Procedures obtained prior to SNF admission: US Abdomen Limited Ruq  Result Date: 12/26/2015 CLINICAL DATA:  Right upper quadrant pain EXAM: US ABDOMEN LIMITED - RIGHT UPPER QUADRANT COMPARISON:  None. FINDINGS: Examination is limited by overlying bowel gas. Gallbladder: No gallstones or wall thickening visualized. No sonographic Murphy sign noted by sonographer. Common bile duct: Diameter: 2.4 mm Liver: No focal lesion identified. Within normal limits in parenchymal echogenicity. IMPRESSION: Normal right upper quadrant ultrasound. Electronically Signed   By: Deatra Robinson M.D.   On: 12/26/2015 19:18     Not all labs, radiology exams or other studies done during hospitalization come through on my EPIC note; however they are reviewed by me.    Assessment and Plan  Headache/concern for meningitis/abnormal CSF/aseptic meningitis- lumbar puncture was performed, white count was 115, with lymphocytes predominating, protein was elevated and glucose was low.  Syphilis cultures were negative as well as serological testing.  CSF may have been negative because antibiotics were started  before the sample was obtained.  Patient improved on broad-spectrum antibiotics and antiviral therapy.  Infectious disease recommended she continue vancomycin and Rocephin through 08/19/2017.  To that in a PICC line was placed on 8/7 SNF -continue vancomycin 1000 mg every 12 hours and Rocephin 2 g every 12 hours until 08/19/2017  Paroxysmal atrial fib- patient is unsteady on her feet and at fall risk; based on this anticoagulation is not recommended until this is improved SNF -continue diltiazem 240 mg daily and Coreg 6.25 mg twice daily with ASA 81 mg daily only as prophylaxis  Hypertension SNF -controlled; continue Coreg 6.25 mg p.o. twice daily, diltiazem 240 mg daily and Zestril 2.5 mg twice daily  Vertigo SNF -continue Antivert 25 mg 3 times daily as needed  Tension type headache/migraine type headaches SNF -continue sumatriptan 25 mg as needed with repeat in 2 hours as needed  Hypothyroidism SNF -not stated as uncontrolled; continue Synthroid 25 mcg daily   Time spent greater than 45 minutes;> 50% of time with patient was spent reviewing records, labs, tests and studies, counseling and developing plan of care  Thurston Hole D. Lyn Hollingshead, MD

## 2017-08-16 ENCOUNTER — Encounter: Payer: Self-pay | Admitting: Internal Medicine

## 2017-08-16 DIAGNOSIS — G030 Nonpyogenic meningitis: Secondary | ICD-10-CM | POA: Insufficient documentation

## 2017-08-16 DIAGNOSIS — R42 Dizziness and giddiness: Secondary | ICD-10-CM | POA: Insufficient documentation

## 2017-08-16 DIAGNOSIS — E039 Hypothyroidism, unspecified: Secondary | ICD-10-CM | POA: Insufficient documentation

## 2017-08-16 DIAGNOSIS — I10 Essential (primary) hypertension: Secondary | ICD-10-CM | POA: Insufficient documentation

## 2017-08-17 LAB — CBC AND DIFFERENTIAL
HCT: 35 — AB (ref 36–46)
Hemoglobin: 11.3 — AB (ref 12.0–16.0)
Platelets: 287 (ref 150–399)
WBC: 9.2

## 2017-08-17 LAB — BASIC METABOLIC PANEL
BUN: 12 (ref 4–21)
CREATININE: 0.8 (ref 0.5–1.1)
GLUCOSE: 107
POTASSIUM: 3.6 (ref 3.4–5.3)
Sodium: 140 (ref 137–147)

## 2017-08-18 LAB — BASIC METABOLIC PANEL: CREATININE: 1 (ref 0.5–1.1)

## 2017-09-04 ENCOUNTER — Non-Acute Institutional Stay (SKILLED_NURSING_FACILITY): Payer: Medicare Other | Admitting: Internal Medicine

## 2017-09-04 ENCOUNTER — Encounter: Payer: Self-pay | Admitting: Internal Medicine

## 2017-09-04 DIAGNOSIS — R839 Unspecified abnormal finding in cerebrospinal fluid: Secondary | ICD-10-CM

## 2017-09-04 DIAGNOSIS — R42 Dizziness and giddiness: Secondary | ICD-10-CM

## 2017-09-04 DIAGNOSIS — E034 Atrophy of thyroid (acquired): Secondary | ICD-10-CM | POA: Diagnosis not present

## 2017-09-04 DIAGNOSIS — Z8679 Personal history of other diseases of the circulatory system: Secondary | ICD-10-CM | POA: Diagnosis not present

## 2017-09-04 DIAGNOSIS — I10 Essential (primary) hypertension: Secondary | ICD-10-CM

## 2017-09-04 DIAGNOSIS — G44209 Tension-type headache, unspecified, not intractable: Secondary | ICD-10-CM

## 2017-09-04 DIAGNOSIS — G030 Nonpyogenic meningitis: Secondary | ICD-10-CM

## 2017-09-04 DIAGNOSIS — Z95810 Presence of automatic (implantable) cardiac defibrillator: Secondary | ICD-10-CM

## 2017-09-04 DIAGNOSIS — H8112 Benign paroxysmal vertigo, left ear: Secondary | ICD-10-CM

## 2017-09-04 NOTE — Progress Notes (Signed)
Location:  [facility] Room Number: [id number] Place of Service:  SNF [phone])  Patricia Bond. Patricia Hollingshead, MD  Patient Care Team: Forrest Moron, MD as PCP - General (Internal Medicine)  Extended Emergency Contact Information Primary Emergency Contact: [facility] Address: [address]          [place], [place] [address] [place] of [place] Home Phone: [phone] Relation: Daughter  Allergies  Allergen Reactions  . Codeine   . Ivp Dye [Iodinated Diagnostic Agents]     rash  . Sulfa Antibiotics     rash    Chief Complaint  Patient presents with  . Discharge Note    Discharge from [facility]    HPI:  83 y.o. female with multiple comorbidities who presented to [facility] ED with the complaint of a severe constant occipital headache associated with nausea and vomiting dating, ongoing for 3 days.  Headache it was reported to be 10 out of 10 in intensity, throbbing with no aggravating or relieving factors.  Patient denied any head trauma.  Patient has a history of tension headaches for which she sees Dr. neurology and has used sumatriptan without relief.  She denied any focal weakness, numbness, or tingling.  She denied any lightheadedness, speech difficulty, visual disturbances or prior history of CVA.  Patient does have a history of vertigo and also some associated dizziness.  Patient was hypertensive when she came into the ED and was given IV labetalol.  Patient was admitted to [facility] from 7/29-8/8 where she was worked up for headache.  There was concern for meningitis and a lumbar puncture was performed which showed a white count of 115 with lymphocytes predominating.  Protein was elevated and glucose was low.  Cultures were negative, serologic testing was negative.  CSF cultures may have been negative because antibiotics were started before the sample was obtained.  Patient improved on broad-spectrum antibiotics and antiviral  therapy.  ID recommended she continue vancomycin and Rocephin through 08/19/2017.  PICC line was placed.  Patient has atrial fibrillation for which she takes anticoagulant but per cardiology anticoagulation was held because she was having difficulty with balance and her fall risk was high.  Patient was still to take diltiazem and Coreg for rate control.  Patient does have an I CD in place.  Patient was admitted to skilled nursing facility for OT/PT and is now ready to be discharged home.    Past Medical History:  Diagnosis Date  . Coronary artery disease   . Hypertension   . Hypothyroidism   . Insomnia   . Pacemaker   . Thyroid disease     Past Surgical History:  Procedure Laterality Date  . ABDOMINAL HYSTERECTOMY    . cardiac ablasion    . pace maker       reports that she has never smoked. She has never used smokeless tobacco. She reports that she does not drink alcohol or use drugs. Social History   Socioeconomic History  . Marital status: Widowed    Spouse name: Not on file  . Number of children: Not on file  . Years of education: Not on file  . Highest education level: Not on file  Occupational History  . Not on file  Social Needs  . Financial resource strain: Not on file  . Food insecurity:    Worry: Not on file    Inability: Not on file  . Transportation needs:  Medical: Not on file    Non-medical: Not on file  Tobacco Use  . Smoking status: Never Smoker  . Smokeless tobacco: Never Used  Substance and Sexual Activity  . Alcohol use: No  . Drug use: No  . Sexual activity: Not on file  Lifestyle  . Physical activity:    Days per week: Not on file    Minutes per session: Not on file  . Stress: Not on file  Relationships  . Social connections:    Talks on phone: Not on file    Gets together: Not on file    Attends religious service: Not on file    Active member of club or organization: Not on file    Attends meetings of clubs or organizations: Not on file     Relationship status: Not on file  . Intimate partner violence:    Fear of current or ex partner: Not on file    Emotionally abused: Not on file    Physically abused: Not on file    Forced sexual activity: Not on file  Other Topics Concern  . Not on file  Social History Narrative  . Not on file    Pertinent  Health Maintenance Due  Topic Date Due  . DEXA SCAN  12/09/2001  . PNA vac Low Risk Adult (1 of 2 - PCV13) 12/09/2001  . INFLUENZA VACCINE  08/06/2017    Medications: Allergies as of 09/04/2017      Reactions   Codeine    Ivp Dye [iodinated Diagnostic Agents]    rash   Sulfa Antibiotics    rash      Medication List        Accurate as of 09/04/17 11:59 PM. Always use your most recent med list.          alendronate 70 MG tablet Commonly known as:  FOSAMAX Take 70 mg by mouth once a week. Take with a full glass of water on an empty stomach.   aspirin 81 MG tablet Take 81 mg by mouth daily.   carvedilol 6.25 MG tablet Commonly known as:  COREG Take 6.25 mg by mouth 2 (two) times daily with a meal.   diltiazem 240 MG 24 hr capsule Commonly known as:  CARDIZEM CD Take 240 mg by mouth daily.   DULoxetine 60 MG capsule Commonly known as:  CYMBALTA Take 60 mg by mouth daily.   folic acid 1 MG tablet Commonly known as:  FOLVITE Take 1 mg by mouth daily.   gabapentin 300 MG capsule Commonly known as:  NEURONTIN Take 300 mg by mouth 2 (two) times daily.   lisinopril 2.5 MG tablet Commonly known as:  PRINIVIL,ZESTRIL Take 2.5 mg by mouth 2 (two) times daily.   LORAZEPAM PO Take 0.5 mg by mouth. 3 tablets (1.5 mg total) by mouth nightly   meclizine 25 MG tablet Commonly known as:  ANTIVERT Take 25 mg by mouth 3 (three) times daily as needed for dizziness.   omeprazole 20 MG capsule Commonly known as:  PRILOSEC Take 20 mg by mouth daily.   rosuvastatin 20 MG tablet Commonly known as:  CRESTOR Take 20 mg by mouth daily.   SUMAtriptan 100 MG  tablet Commonly known as:  IMITREX Take 100 mg by mouth every 12 (twelve) hours as needed for migraine or headache. May repeat in 2 hours if headache persists or recurs.   SYNTHROID PO Take by mouth. 50 mcg by mouth daily at 0600   thiamine  100 MG tablet Take 100 mg by mouth daily.        Vitals:   09/04/17 1112  BP: 119/71  Pulse: (!) 103  Resp: 18  Temp: (!) 97.5 F (36.4 C)  Weight: 161 lb (73 kg)  Height: 5\' 6"  (1.676 m)   Body mass index is 25.99 kg/m.  Physical Exam  GENERAL APPEARANCE: Alert, conversant. No acute distress.  HEENT: Unremarkable. RESPIRATORY: Breathing is even, unlabored. Lung sounds are clear   CARDIOVASCULAR: Heart RRR no murmurs, rubs or gallops. No peripheral edema.  GASTROINTESTINAL: Abdomen is soft, non-tender, not distended w/ normal bowel sounds.  NEUROLOGIC: Cranial nerves 2-12 grossly intact. Moves all extremities   Labs reviewed: Basic Metabolic Panel: Recent Labs    08/17/17 08/18/17  NA 140  --   K 3.6  --   BUN 12  --   CREATININE 0.8 1.0   No results found for: [facility] Liver Function Tests: No results for input(s): AST, ALT, ALKPHOS, BILITOT, PROT, ALBUMIN in the last 8760 hours. No results for input(s): LIPASE, AMYLASE in the last 8760 hours. No results for input(s): AMMONIA in the last 8760 hours. CBC: Recent Labs    08/17/17  WBC 9.2  HGB 11.3*  HCT 35*  PLT 287   Lipid No results for input(s): CHOL, HDL, LDLCALC, TRIG in the last 8760 hours. Cardiac Enzymes: No results for input(s): CKTOTAL, CKMB, CKMBINDEX, TROPONINI in the last 8760 hours. BNP: No results for input(s): BNP in the last 8760 hours. CBG: No results for input(s): GLUCAP in the last 8760 hours.  Procedures and Imaging Studies During Stay: No results found.  Assessment/Plan:   Abnormal finding in CSF  Meningitis, aseptic  History of atrial fibrillation  Hypothyroidism due to acquired atrophy of thyroid  Acute non intractable  tension-type headache  Benign paroxysmal positional vertigo of left ear  Essential hypertension  Vertigo  ICD (implantable cardioverter-defibrillator) in place   Patient is being discharged with the following home health services:  OT/PT/Nursing/Aide  Patient is being discharged with the following durable medical equipment:  none  Patient has been advised to f/u with their PCP in 1-2 weeks to bring them up to date on their rehab stay.  Social services at facility was responsible for arranging this appointment.  Pt was provided with a 30 day supply of prescriptions for medications and refills must be obtained from their PCP.  For controlled substances, a more limited supply may be provided adequate until PCP appointment only.  Medications have been reconciled.  Time spent greater than 30 minutes;> 50% of time with patient was spent reviewing records, labs, tests and studies, counseling and developing plan of care  Patricia Goldsmithnne D. Patricia HollingsheadAlexander, MD

## 2017-09-12 ENCOUNTER — Encounter: Payer: Self-pay | Admitting: Internal Medicine

## 2017-09-29 ENCOUNTER — Other Ambulatory Visit: Payer: Self-pay | Admitting: Internal Medicine

## 2017-12-22 ENCOUNTER — Other Ambulatory Visit: Payer: Self-pay | Admitting: Internal Medicine

## 2017-12-27 ENCOUNTER — Encounter (HOSPITAL_BASED_OUTPATIENT_CLINIC_OR_DEPARTMENT_OTHER): Payer: Self-pay | Admitting: Emergency Medicine

## 2017-12-27 ENCOUNTER — Emergency Department (HOSPITAL_BASED_OUTPATIENT_CLINIC_OR_DEPARTMENT_OTHER)
Admission: EM | Admit: 2017-12-27 | Discharge: 2017-12-27 | Disposition: A | Discharge status: Home / Self Care | Payer: Medicare Other | Attending: Emergency Medicine | Admitting: Emergency Medicine

## 2017-12-27 ENCOUNTER — Other Ambulatory Visit: Payer: Self-pay

## 2017-12-27 DIAGNOSIS — E039 Hypothyroidism, unspecified: Secondary | ICD-10-CM | POA: Diagnosis not present

## 2017-12-27 DIAGNOSIS — R21 Rash and other nonspecific skin eruption: Secondary | ICD-10-CM | POA: Insufficient documentation

## 2017-12-27 DIAGNOSIS — Z79899 Other long term (current) drug therapy: Secondary | ICD-10-CM | POA: Diagnosis not present

## 2017-12-27 DIAGNOSIS — I2510 Atherosclerotic heart disease of native coronary artery without angina pectoris: Secondary | ICD-10-CM | POA: Diagnosis not present

## 2017-12-27 DIAGNOSIS — I10 Essential (primary) hypertension: Secondary | ICD-10-CM | POA: Diagnosis not present

## 2017-12-27 DIAGNOSIS — Z7982 Long term (current) use of aspirin: Secondary | ICD-10-CM | POA: Diagnosis not present

## 2017-12-27 DIAGNOSIS — Z950 Presence of cardiac pacemaker: Secondary | ICD-10-CM | POA: Insufficient documentation

## 2017-12-27 MED ORDER — DEXAMETHASONE SODIUM PHOSPHATE 10 MG/ML IJ SOLN
10.0000 mg | Freq: Once | INTRAMUSCULAR | Status: AC
  Administered 2017-12-27: 10 mg via INTRAMUSCULAR
  Filled 2017-12-27: qty 1

## 2017-12-27 MED ORDER — FAMOTIDINE 20 MG PO TABS: 20.0000 mg | ORAL_TABLET | Freq: Two times a day (BID) | ORAL | 0 refills | Status: DC

## 2017-12-27 MED ORDER — PREDNISONE 50 MG PO TABS: 50.0000 mg | ORAL_TABLET | Freq: Every day | ORAL | 0 refills | Status: DC

## 2017-12-27 NOTE — Discharge Instructions (Addendum)
You can take benadryl for itching. Follow up with your doctor. Return here as needed

## 2017-12-27 NOTE — ED Triage Notes (Signed)
Reports pruritic rash to chest x 3 weeks

## 2017-12-28 NOTE — ED Provider Notes (Signed)
[facility] EMERGENCY DEPARTMENT Provider Note   CSN: [id number] Arrival date & time: 12/27/17  1636     History   Chief Complaint Chief Complaint  Patient presents with  . Rash    HPI Patricia Bond is a 83 y.o. female.  HPI Patient presents to the emergency department with a rash around the anterior upper chest in the area of the collarbones.  The patient states that this is been ongoing over the last 3 weeks.  She states that the rash does itch.  She states there is been no other symptoms associated with it.  She states she does not know of any new soaps lotions or detergents.  The patient denies chest pain, shortness of breath, headache,blurred vision, neck pain, fever, cough, weakness, numbness, dizziness, anorexia, edema, abdominal pain, nausea, vomiting, diarrhea, back pain, dysuria, hematemesis, bloody stool, near syncope, or syncope. Past Medical History:  Diagnosis Date  . Coronary artery disease   . Hypertension   . Hypothyroidism   . Insomnia   . Pacemaker   . Thyroid disease     Patient Active Problem List   Diagnosis Date Noted  . Meningitis, aseptic 08/16/2017  . Hypertension 08/16/2017  . Vertigo 08/16/2017  . Hypothyroidism 08/16/2017  . Abnormal finding in CSF 08/12/2017  . History of atrial fibrillation 08/03/2017  . Tension-type headache, not intractable 09/24/2016  . Benign paroxysmal positional vertigo of left ear 01/04/2016  . ICD (implantable cardioverter-defibrillator) in place 01/18/2015    Past Surgical History:  Procedure Laterality Date  . ABDOMINAL HYSTERECTOMY    . cardiac ablasion    . pace maker       OB History   No obstetric history on file.      Home Medications    Prior to Admission medications   Medication Sig Start Date End Date Taking? Authorizing Provider  alendronate (FOSAMAX) 70 MG tablet Take 70 mg by mouth once a week. Take with a full glass of water on an empty stomach.    [provider]    aspirin 81 MG tablet Take 81 mg by mouth daily.    [provider]  carvedilol (COREG) 6.25 MG tablet Take 6.25 mg by mouth 2 (two) times daily with a meal.    [provider]  diltiazem (CARDIZEM CD) 240 MG 24 hr capsule Take 240 mg by mouth daily.    [provider]  DULoxetine (CYMBALTA) 60 MG capsule Take 60 mg by mouth daily.    [provider]  famotidine (PEPCID) 20 MG tablet Take 1 tablet (20 mg total) by mouth 2 (two) times daily. 12/27/17   Eliany Mccarter, Cristal Deerhristopher, PA-C  folic acid (FOLVITE) 1 MG tablet Take 1 mg by mouth daily.    [provider]  gabapentin (NEURONTIN) 300 MG capsule Take 300 mg by mouth 2 (two) times daily.    [provider]  Levothyroxine Sodium (SYNTHROID PO) Take by mouth. 50 mcg by mouth daily at 0600    [provider]  lisinopril (PRINIVIL,ZESTRIL) 2.5 MG tablet Take 2.5 mg by mouth 2 (two) times daily.    [provider]  LORAZEPAM PO Take 0.5 mg by mouth. 3 tablets (1.5 mg total) by mouth nightly    [provider]  meclizine (ANTIVERT) 25 MG tablet Take 25 mg by mouth 3 (three) times daily as needed for dizziness.    [provider]  omeprazole (PRILOSEC) 20 MG capsule Take 20 mg by mouth daily.  [provider]  predniSONE (DELTASONE) 50 MG tablet Take 1 tablet (50 mg total) by mouth daily with breakfast. 12/27/17   Yanin Muhlestein, Cristal Deerhristopher, PA-C  rosuvastatin (CRESTOR) 20 MG tablet Take 20 mg by mouth daily.    [provider]  SUMAtriptan (IMITREX) 100 MG tablet Take 100 mg by mouth every 12 (twelve) hours as needed for migraine or headache. May repeat in 2 hours if headache persists or recurs.    [provider]  thiamine 100 MG tablet Take 100 mg by mouth daily.    [provider]    Family History Family History  Problem Relation Age of Onset  . Heart disease Mother   . Heart disease Brother     Social History Social History    Tobacco Use  . Smoking status: Never Smoker  . Smokeless tobacco: Never Used  Substance Use Topics  . Alcohol use: No  . Drug use: No     Allergies   Codeine; Ivp dye [iodinated diagnostic agents]; and Sulfa antibiotics   Review of Systems Review of Systems All other systems negative except as documented in the HPI. All pertinent positives and negatives as reviewed in the HPI.  Physical Exam Updated Vital Signs BP 111/61 (BP Location: Left Arm)   Pulse 78   Temp (!) 97.4 F (36.3 C) (Oral)   Resp 18   Ht 5\' 2"  (1.575 m)   Wt 68 kg   SpO2 96%   BMI 27.44 kg/m   Physical Exam Vitals signs and nursing note reviewed.  Constitutional:      General: She is not in acute distress.    Appearance: She is well-developed.  HENT:     Head: Normocephalic and atraumatic.  Eyes:     Pupils: Pupils are equal, round, and reactive to light.  Pulmonary:     Effort: Pulmonary effort is normal.  Skin:    General: Skin is warm and dry.     Findings: Rash present.       Neurological:     Mental Status: She is alert and oriented to person, place, and time.      ED Treatments / Results  Labs (all labs ordered are listed, but only abnormal results are displayed) Labs Reviewed - No data to display  EKG None  Radiology No results found.  Procedures Procedures (including critical care time)  Medications Ordered in ED Medications  dexamethasone (DECADRON) injection 10 mg (10 mg Intramuscular Given 12/27/17 1808)     Initial Impression / Assessment and Plan / ED Course  I have reviewed the triage vital signs and the nursing notes.  Pertinent labs & imaging results that were available during my care of the patient were reviewed by me and considered in my medical decision making (see chart for details).     Patient to be treated for standard rash with prednisone Benadryl and Pepcid.  Have advised her to return here for any worsening in her condition.  Told to  follow-up with her primary doctor.  Told to keep the area clean and dry.  Final Clinical Impressions(s) / ED Diagnoses   Final diagnoses:  Rash    ED Discharge Orders         Ordered    predniSONE (DELTASONE) 50 MG tablet  Daily with breakfast     12/27/17 1759    famotidine (PEPCID) 20 MG tablet  2 times daily     12/27/17 1759  Charlestine Night, PA-C 12/28/17 0018    Sabas Sous, MD 12/28/17 [phone]

## 2018-01-30 ENCOUNTER — Emergency Department (HOSPITAL_BASED_OUTPATIENT_CLINIC_OR_DEPARTMENT_OTHER)
Admission: EM | Admit: 2018-01-30 | Discharge: 2018-01-30 | Disposition: A | Discharge status: Home / Self Care | Payer: Medicare Other | Attending: Emergency Medicine | Admitting: Emergency Medicine

## 2018-01-30 ENCOUNTER — Emergency Department (HOSPITAL_BASED_OUTPATIENT_CLINIC_OR_DEPARTMENT_OTHER): Payer: Medicare Other

## 2018-01-30 ENCOUNTER — Other Ambulatory Visit: Payer: Self-pay

## 2018-01-30 ENCOUNTER — Encounter (HOSPITAL_BASED_OUTPATIENT_CLINIC_OR_DEPARTMENT_OTHER): Payer: Self-pay | Admitting: *Deleted

## 2018-01-30 DIAGNOSIS — Y92012 Bathroom of single-family (private) house as the place of occurrence of the external cause: Secondary | ICD-10-CM | POA: Insufficient documentation

## 2018-01-30 DIAGNOSIS — Z79899 Other long term (current) drug therapy: Secondary | ICD-10-CM | POA: Diagnosis not present

## 2018-01-30 DIAGNOSIS — S59911A Unspecified injury of right forearm, initial encounter: Secondary | ICD-10-CM | POA: Diagnosis present

## 2018-01-30 DIAGNOSIS — W01198A Fall on same level from slipping, tripping and stumbling with subsequent striking against other object, initial encounter: Secondary | ICD-10-CM | POA: Insufficient documentation

## 2018-01-30 DIAGNOSIS — Z7982 Long term (current) use of aspirin: Secondary | ICD-10-CM | POA: Insufficient documentation

## 2018-01-30 DIAGNOSIS — Z950 Presence of cardiac pacemaker: Secondary | ICD-10-CM | POA: Diagnosis not present

## 2018-01-30 DIAGNOSIS — S41111A Laceration without foreign body of right upper arm, initial encounter: Secondary | ICD-10-CM | POA: Insufficient documentation

## 2018-01-30 DIAGNOSIS — I2510 Atherosclerotic heart disease of native coronary artery without angina pectoris: Secondary | ICD-10-CM | POA: Diagnosis not present

## 2018-01-30 DIAGNOSIS — I10 Essential (primary) hypertension: Secondary | ICD-10-CM | POA: Diagnosis not present

## 2018-01-30 DIAGNOSIS — Y998 Other external cause status: Secondary | ICD-10-CM | POA: Diagnosis not present

## 2018-01-30 DIAGNOSIS — R42 Dizziness and giddiness: Secondary | ICD-10-CM | POA: Insufficient documentation

## 2018-01-30 DIAGNOSIS — Z23 Encounter for immunization: Secondary | ICD-10-CM | POA: Diagnosis not present

## 2018-01-30 DIAGNOSIS — E039 Hypothyroidism, unspecified: Secondary | ICD-10-CM | POA: Insufficient documentation

## 2018-01-30 DIAGNOSIS — S52121A Displaced fracture of head of right radius, initial encounter for closed fracture: Secondary | ICD-10-CM

## 2018-01-30 DIAGNOSIS — Y9389 Activity, other specified: Secondary | ICD-10-CM | POA: Diagnosis not present

## 2018-01-30 DIAGNOSIS — W19XXXA Unspecified fall, initial encounter: Secondary | ICD-10-CM

## 2018-01-30 MED ORDER — LIDOCAINE HCL 2 % IJ SOLN
INTRAMUSCULAR | Status: AC
  Administered 2018-01-30: 400 mg via INTRADERMAL
  Filled 2018-01-30: qty 20

## 2018-01-30 MED ORDER — LIDOCAINE-EPINEPHRINE (PF) 2 %-1:200000 IJ SOLN
20.0000 mL | Freq: Once | INTRAMUSCULAR | Status: DC
  Filled 2018-01-30: qty 20

## 2018-01-30 MED ORDER — LIDOCAINE HCL 2 % IJ SOLN
20.0000 mL | Freq: Once | INTRAMUSCULAR | Status: AC
  Administered 2018-01-30: 400 mg via INTRADERMAL

## 2018-01-30 MED ORDER — ACETAMINOPHEN 325 MG PO TABS
650.0000 mg | ORAL_TABLET | Freq: Once | ORAL | Status: AC
  Administered 2018-01-30: 650 mg via ORAL
  Filled 2018-01-30: qty 2

## 2018-01-30 MED ORDER — TETANUS-DIPHTH-ACELL PERTUSSIS 5-2.5-18.5 LF-MCG/0.5 IM SUSP
0.5000 mL | Freq: Once | INTRAMUSCULAR | Status: AC
  Administered 2018-01-30: 0.5 mL via INTRAMUSCULAR
  Filled 2018-01-30: qty 0.5

## 2018-01-30 NOTE — Discharge Instructions (Addendum)
X-rays today showed a possible fracture through 1 of your arm bones.  Call the orthopedic doctor to schedule an appointment for Tuesday.  He has 2 office locations, both of them are listed below. Use ice over top of the splint, 20 minutes at a time. Use Tylenol as needed for pain. Return to the emergency room if you develop numbness of your hand, fevers, severe worsening pain, or any new, worsening, or concerning symptoms.

## 2018-01-30 NOTE — ED Provider Notes (Signed)
[facility] EMERGENCY DEPARTMENT Provider Note   CSN: [id number] Arrival date & time: 01/30/18  1502     History   Chief Complaint Chief Complaint  Patient presents with  . Fall    HPI Patricia Bond is a 84 y.o. female resenting for evaluation after a fall.  Patient states she was in the bathroom when she slipped, hitting her right elbow and landing on her side.  She states that she hit her head gently, but is having no head pain.  She had some mild dizziness when she first tried to stand up, but this has since resolved.  She denies head pain, neck pain, back pain, vision changes, slurred speech, chest pain, shortness breath, nausea, vomiting, domino pain, loss of bowel bladder control, numbness or tingling.  She has ambulated since the fall without difficulty.  Patient lives at home with her daughter, uses a walker at baseline.  She is on Eliquis, has been taking it as prescribed.  She reports continued pain at her right elbow, denies pain elsewhere. Pain is worse with movement. She has not taken anything for pain.   HPI  Past Medical History:  Diagnosis Date  . Coronary artery disease   . Hypertension   . Hypothyroidism   . Insomnia   . Pacemaker   . Thyroid disease     Patient Active Problem List   Diagnosis Date Noted  . Meningitis, aseptic 08/16/2017  . Hypertension 08/16/2017  . Vertigo 08/16/2017  . Hypothyroidism 08/16/2017  . Abnormal finding in CSF 08/12/2017  . History of atrial fibrillation 08/03/2017  . Tension-type headache, not intractable 09/24/2016  . Benign paroxysmal positional vertigo of left ear 01/04/2016  . ICD (implantable cardioverter-defibrillator) in place 01/18/2015    Past Surgical History:  Procedure Laterality Date  . ABDOMINAL HYSTERECTOMY    . cardiac ablasion    . pace maker       OB History   No obstetric history on file.      Home Medications    Prior to Admission medications   Medication Sig Start Date End  Date Taking? Authorizing Provider  alendronate (FOSAMAX) 70 MG tablet Take 70 mg by mouth once a week. Take with a full glass of water on an empty stomach.    [provider]  aspirin 81 MG tablet Take 81 mg by mouth daily.    [provider]  carvedilol (COREG) 6.25 MG tablet Take 6.25 mg by mouth 2 (two) times daily with a meal.    [provider]  diltiazem (CARDIZEM CD) 240 MG 24 hr capsule Take 240 mg by mouth daily.    [provider]  DULoxetine (CYMBALTA) 60 MG capsule Take 60 mg by mouth daily.    [provider]  famotidine (PEPCID) 20 MG tablet Take 1 tablet (20 mg total) by mouth 2 (two) times daily. 12/27/17   Lawyer, Cristal Deer, PA-C  folic acid (FOLVITE) 1 MG tablet Take 1 mg by mouth daily.    [provider]  gabapentin (NEURONTIN) 300 MG capsule Take 300 mg by mouth 2 (two) times daily.    [provider]  Levothyroxine Sodium (SYNTHROID PO) Take by mouth. 50 mcg by mouth daily at 0600    [provider]  lisinopril (PRINIVIL,ZESTRIL) 2.5 MG tablet Take 2.5 mg by mouth 2 (two) times daily.    [provider]  LORAZEPAM PO Take 0.5 mg by mouth. 3 tablets (1.5 mg total) by mouth nightly  [provider]  meclizine (ANTIVERT) 25 MG tablet Take 25 mg by mouth 3 (three) times daily as needed for dizziness.    [provider]  omeprazole (PRILOSEC) 20 MG capsule Take 20 mg by mouth daily.    [provider]  predniSONE (DELTASONE) 50 MG tablet Take 1 tablet (50 mg total) by mouth daily with breakfast. 12/27/17   Lawyer, Cristal Deerhristopher, PA-C  rosuvastatin (CRESTOR) 20 MG tablet Take 20 mg by mouth daily.    [provider]  SUMAtriptan (IMITREX) 100 MG tablet Take 100 mg by mouth every 12 (twelve) hours as needed for migraine or headache. May repeat in 2 hours if headache persists or recurs.    [provider]  thiamine 100 MG tablet Take 100 mg by mouth daily.     [provider]    Family History Family History  Problem Relation Age of Onset  . Heart disease Mother   . Heart disease Brother     Social History Social History   Tobacco Use  . Smoking status: Never Smoker  . Smokeless tobacco: Never Used  Substance Use Topics  . Alcohol use: No  . Drug use: No     Allergies   Codeine; Ivp dye [iodinated diagnostic agents]; and Sulfa antibiotics   Review of Systems Review of Systems  Musculoskeletal: Positive for arthralgias.  Skin: Positive for wound.  Neurological: Positive for dizziness (resolved).  Hematological: Bruises/bleeds easily.  All other systems reviewed and are negative.    Physical Exam Updated Vital Signs BP (!) 121/47 (BP Location: Left Arm)   Pulse 78   Temp 98 F (36.7 C) (Oral)   Resp 16   Ht 5\' 2"  (1.575 m)   Wt 59 kg   SpO2 98%   BMI 23.78 kg/m   Physical Exam Vitals signs and nursing note reviewed.  Constitutional:      General: She is not in acute distress.    Appearance: She is well-developed.     Comments: Elderly female resting comfortably in the bed in no acute distress  HENT:     Head: Normocephalic and atraumatic.     Comments: Sign of head injury or trauma.  No hematoma, laceration, or contusion.  No malocclusion or trismus.  No tympanum or nasal septal hematoma Eyes:     Extraocular Movements: Extraocular movements intact.     Conjunctiva/sclera: Conjunctivae normal.     Pupils: Pupils are equal, round, and reactive to light.     Comments: EOMI and PERRLA.  No nystagmus.  Neck:     Musculoskeletal: Normal range of motion and neck supple.     Comments: No tenderness palpation of midline C-spine.  Full active range of motion of the neck without difficulty. Cardiovascular:     Rate and Rhythm: Normal rate and regular rhythm.     Pulses: Normal pulses.  Pulmonary:     Effort: Pulmonary effort is normal. No respiratory distress.     Breath sounds: Normal breath sounds. No  wheezing.  Abdominal:     General: There is no distension.     Palpations: Abdomen is soft. There is no mass.     Tenderness: There is no abdominal tenderness. There is no guarding or rebound.  Musculoskeletal:        General: Tenderness and signs of injury present.     Comments: Full active range of motion of the right elbow with pain.  Laceration overlying the right elbow without active bleeding.  Grip strength  intact bilaterally.  Radial pulses intact bilaterally.  Sensation of upper extremities intact. No tenderness palpation midline spine.  Contusion noted on the left mid back, pt not sure if this is new. No injuey or deformity noted elsewhere  Skin:    General: Skin is warm and dry.     Capillary Refill: Capillary refill takes less than 2 seconds.  Neurological:     General: No focal deficit present.     Mental Status: She is alert and oriented to person, place, and time.     Comments: No neuro deficit. CN intact. Grip strength intact. Fine movement and coordination intact.   Psychiatric:        Mood and Affect: Mood normal.      ED Treatments / Results  Labs (all labs ordered are listed, but only abnormal results are displayed) Labs Reviewed - No data to display  EKG None  Radiology Dg Chest 2 View  Result Date: 01/30/2018 CLINICAL DATA:  Status post fall. History of pacemaker placement 5 years ago. EXAM: CHEST - 2 VIEW COMPARISON:  None. FINDINGS: Dual lead cardiac pacemaker with leads overlying the right atrium and right ventricle. Cardiomediastinal silhouette is normal. Mediastinal contours appear intact. There is no evidence of focal airspace consolidation, pleural effusion or pneumothorax. Spondylosis of the thoracic spine.  Soft tissues are grossly normal. IMPRESSION: No active cardiopulmonary disease. Electronically Signed   By: Ted Mcalpineobrinka  Dimitrova M.D.   On: 01/30/2018 17:03   Dg Elbow Complete Right  Result Date: 01/30/2018 CLINICAL DATA:  Status post fall with  right elbow pain. EXAM: RIGHT ELBOW - COMPLETE 3+ VIEW COMPARISON:  None. FINDINGS: Slight step-off of the articular surface of the radial head, possible minimally displaced fracture. Minimal anterior joint effusion. No other fractures are seen. IMPRESSION: Suspected minimally displaced radial head fracture. Electronically Signed   By: Ted Mcalpineobrinka  Dimitrova M.D.   On: 01/30/2018 17:02   Ct Head Wo Contrast  Result Date: 01/30/2018 CLINICAL DATA:  Status post fall.  Hit head on floor. EXAM: CT HEAD WITHOUT CONTRAST CT CERVICAL SPINE WITHOUT CONTRAST TECHNIQUE: Multidetector CT imaging of the head and cervical spine was performed following the standard protocol without intravenous contrast. Multiplanar CT image reconstructions of the cervical spine were also generated. COMPARISON:  01/30/2018 FINDINGS: CT HEAD FINDINGS Brain: No evidence of acute infarction, hemorrhage, hydrocephalus, extra-axial collection or mass lesion/mass effect. Prominence of the sulci and ventricles identified compatible with brain atrophy. Vascular: No hyperdense vessel or unexpected calcification. Skull: Normal. Negative for fracture or focal lesion. Sinuses/Orbits: Postoperative changes from bilateral median antrectomy. Moderate chronic bilateral mucoperiosteal thickening involving the maxillary sinuses, sphenoid sinus, ethmoid air cells and frontal sinus noted. Other: None. CT CERVICAL SPINE FINDINGS Alignment: Normal. Skull base and vertebrae: No acute fracture. No primary bone lesion or focal pathologic process. Soft tissues and spinal canal: No prevertebral fluid or swelling. No visible canal hematoma. Disc levels: Disc space narrowing and endplate spurring identified at C6-7. Upper chest: Negative. Other: None IMPRESSION: 1. No acute intracranial abnormalities. 2. Chronic small vessel ischemic disease and brain atrophy. 3. Advanced chronic sinus disease status post bilateral median antrectomy. 4. No evidence for cervical spine  fracture. Electronically Signed   By: Signa Kellaylor  Stroud M.D.   On: 01/30/2018 16:38   Ct Cervical Spine Wo Contrast  Result Date: 01/30/2018 CLINICAL DATA:  Status post fall.  Hit head on floor. EXAM: CT HEAD WITHOUT CONTRAST CT CERVICAL SPINE WITHOUT CONTRAST TECHNIQUE: Multidetector CT imaging of the head and cervical  spine was performed following the standard protocol without intravenous contrast. Multiplanar CT image reconstructions of the cervical spine were also generated. COMPARISON:  01/30/2018 FINDINGS: CT HEAD FINDINGS Brain: No evidence of acute infarction, hemorrhage, hydrocephalus, extra-axial collection or mass lesion/mass effect. Prominence of the sulci and ventricles identified compatible with brain atrophy. Vascular: No hyperdense vessel or unexpected calcification. Skull: Normal. Negative for fracture or focal lesion. Sinuses/Orbits: Postoperative changes from bilateral median antrectomy. Moderate chronic bilateral mucoperiosteal thickening involving the maxillary sinuses, sphenoid sinus, ethmoid air cells and frontal sinus noted. Other: None. CT CERVICAL SPINE FINDINGS Alignment: Normal. Skull base and vertebrae: No acute fracture. No primary bone lesion or focal pathologic process. Soft tissues and spinal canal: No prevertebral fluid or swelling. No visible canal hematoma. Disc levels: Disc space narrowing and endplate spurring identified at C6-7. Upper chest: Negative. Other: None IMPRESSION: 1. No acute intracranial abnormalities. 2. Chronic small vessel ischemic disease and brain atrophy. 3. Advanced chronic sinus disease status post bilateral median antrectomy. 4. No evidence for cervical spine fracture. Electronically Signed   By: Signa Kell M.D.   On: 01/30/2018 16:38    Procedures .Marland KitchenLaceration Repair Date/Time: 01/30/2018 6:05 PM Performed by: Alveria Apley, PA-C Authorized by: Alveria Apley, PA-C   Consent:    Consent obtained:  Verbal   Consent given by:  Patient    Risks discussed:  Infection, pain, poor cosmetic result, poor wound healing and need for additional repair Anesthesia (see MAR for exact dosages):    Anesthesia method:  Local infiltration   Local anesthetic:  Lidocaine 2% WITH epi Laceration details:    Location:  Shoulder/arm   Shoulder/arm location:  R elbow   Length (cm):  3   Depth (mm):  3 Repair type:    Repair type:  Simple Pre-procedure details:    Preparation:  Patient was prepped and draped in usual sterile fashion and imaging obtained to evaluate for foreign bodies Exploration:    Wound exploration: wound explored through full range of motion and entire depth of wound probed and visualized     Wound extent: no foreign bodies/material noted, no muscle damage noted, no tendon damage noted and no vascular damage noted   Treatment:    Area cleansed with:  Saline   Amount of cleaning:  Extensive   Irrigation solution:  Sterile water   Irrigation method:  Pressure wash Skin repair:    Repair method:  Sutures   Suture size:  5-0   Suture material:  Prolene   Suture technique:  Simple interrupted   Number of sutures:  5 Approximation:    Approximation:  Close Post-procedure details:    Dressing:  Sterile dressing   Patient tolerance of procedure:  Tolerated well, no immediate complications .Splint Application Date/Time: 01/30/2018 6:09 PM Performed by: Cristy Friedlander, EMT Authorized by: Alveria Apley, PA-C   Consent:    Consent obtained:  Verbal   Consent given by:  Patient   Risks discussed:  Discoloration, numbness, pain and swelling Pre-procedure details:    Sensation:  Normal   Skin color:  P, w, d Procedure details:    Laterality:  Right   Location:  Elbow   Elbow:  R elbow   Cast type:  Long arm   Splint type:  Long arm Post-procedure details:    Pain:  Unchanged   Sensation:  Normal   Skin color:  P, w, d   Patient tolerance of procedure:  Tolerated well, no immediate complications    (including  critical care time)  Medications Ordered in ED Medications  Tdap (BOOSTRIX) injection 0.5 mL (0.5 mLs Intramuscular Given 01/30/18 1734)  acetaminophen (TYLENOL) tablet 650 mg (650 mg Oral Given 01/30/18 1646)  lidocaine (XYLOCAINE) 2 % (with pres) injection 400 mg (400 mg Intradermal Given by Other 01/30/18 1740)     Initial Impression / Assessment and Plan / ED Course  I have reviewed the triage vital signs and the nursing notes.  Pertinent labs & imaging results that were available during my care of the patient were reviewed by me and considered in my medical decision making (see chart for details).     Pt presenting for evaluation after mechanical fall.  Physical exam reassuring, she is neurovascularly intact.  Patient does have laceration of the right elbow, but no obvious muscular involvement.  As she is on Eliquis and did hit her head, will obtain CT head and neck for further evaluation.  Patient with bruise on her left back, unsure if it is new.  As such, will obtain chest x-ray.  Will obtain x-ray of the elbow considering patient's cut and pain.  CT head and neck reassuring.  Chest x-ray without acute or concerning findings.  Elbow x-ray viewed interpreted by me, shows possible radial head fracture with mild displacement.  Cut is distal to the radial head, do not believe this is an open fracture.  Laceration extensively cleaned and repaired as described above.  Aftercare instructions given.  As patient has radial head fracture, will consult with Ortho for further management.  Discussed with Dr. Luiz Blare from orthopedics, who recommends posterior long-arm splint, sling, and follow-up in the office early next week.  Case discussed with attending, Dr. Criss Alvine evaluated the patient.  Findings and follow-up instructions discussed with patient and grandson.  Strict return precautions given.  At this time, patient appears safe for discharge.  Return precautions given.  Patient states  she understands and agrees to plan.   Final Clinical Impressions(s) / ED Diagnoses   Final diagnoses:  Fall, initial encounter  Arm laceration, right, initial encounter  Closed displaced fracture of head of right radius, initial encounter    ED Discharge Orders    None       Alveria Apley, PA-C 01/30/18 1812    Pricilla Loveless, MD 01/30/18 2336

## 2018-01-30 NOTE — ED Triage Notes (Signed)
Pt reports she tripped and fell in her bathroom today. Denies LOC. States "I think I hit the floor". Has bandage to right elbow (Not removed in triage). Moves all extremities well. Pt is on eliquis. Cao x 4

## 2018-01-30 NOTE — ED Notes (Signed)
meds at bedside, pt at xray

## 2018-02-15 ENCOUNTER — Encounter (HOSPITAL_BASED_OUTPATIENT_CLINIC_OR_DEPARTMENT_OTHER): Payer: Self-pay | Admitting: *Deleted

## 2018-02-15 ENCOUNTER — Emergency Department (HOSPITAL_BASED_OUTPATIENT_CLINIC_OR_DEPARTMENT_OTHER)
Admission: EM | Admit: 2018-02-15 | Discharge: 2018-02-15 | Disposition: A | Discharge status: Home / Self Care | Payer: Medicare Other | Attending: Emergency Medicine | Admitting: Emergency Medicine

## 2018-02-15 ENCOUNTER — Other Ambulatory Visit: Payer: Self-pay

## 2018-02-15 DIAGNOSIS — I10 Essential (primary) hypertension: Secondary | ICD-10-CM | POA: Insufficient documentation

## 2018-02-15 DIAGNOSIS — E039 Hypothyroidism, unspecified: Secondary | ICD-10-CM | POA: Diagnosis not present

## 2018-02-15 DIAGNOSIS — W19XXXD Unspecified fall, subsequent encounter: Secondary | ICD-10-CM | POA: Diagnosis not present

## 2018-02-15 DIAGNOSIS — Z7982 Long term (current) use of aspirin: Secondary | ICD-10-CM | POA: Insufficient documentation

## 2018-02-15 DIAGNOSIS — S51811D Laceration without foreign body of right forearm, subsequent encounter: Secondary | ICD-10-CM | POA: Diagnosis present

## 2018-02-15 DIAGNOSIS — Z79899 Other long term (current) drug therapy: Secondary | ICD-10-CM | POA: Insufficient documentation

## 2018-02-15 DIAGNOSIS — I2510 Atherosclerotic heart disease of native coronary artery without angina pectoris: Secondary | ICD-10-CM | POA: Insufficient documentation

## 2018-02-15 DIAGNOSIS — Z4802 Encounter for removal of sutures: Secondary | ICD-10-CM | POA: Diagnosis not present

## 2018-02-15 NOTE — ED Notes (Signed)
Appointment made for patient at Dr. Luiz BlareGraves office on 2/15 at 10:15 am. Info given to pt and POA.

## 2018-02-15 NOTE — Discharge Instructions (Addendum)
Keep the arm in a position of comfort.  Elevated as necessary. May continue to take Tylenol, as needed for pain. Follow-up with the orthopedic specialist.  There is an appointment scheduled February 15 at 10:15 AM. Return to the ED for numbness, significantly increased pain, swelling, or any other major concerns.

## 2018-02-15 NOTE — ED Triage Notes (Signed)
States she needs to have sutures removed from her right arm. She was supposed to come back 2 weeks ago.

## 2018-02-15 NOTE — ED Provider Notes (Addendum)
[facility] EMERGENCY DEPARTMENT Provider Note   CSN: [id number] Arrival date & time: 02/15/18  1440     History   Chief Complaint Chief Complaint  Patient presents with  . Suture / Staple Removal    HPI Patricia Bond is a 84 y.o. female.  HPI   Patricia Bond is a 84 y.o. female, with a history of CAD, HTN, pacemaker, and hypothyroidism, presenting to the ED stating she needs suture removal from a wound on her right forearm. Patient sustained a fall and laceration January 25.  She was seen in the ED and this laceration was repaired.  During that visit, she was also noted to have a radial head fracture.  She was placed in a splint and told to follow-up with orthopedics.  Patient is accompanied by her daughter at the bedside, who states she had been trying to get the patient follow-up with the orthopedist the patient is seen in the past, Dr. Thamas Jaegers with [facility].  Daughter states she is frustrated because she has not been given an appointment and feels as though she has been getting the run around.  They have not tried to contact the physician on the AVS from her ED visit.  Patient has been taking Tylenol for pain.  She has not sustained any repeat falls or trauma. Denies numbness, weakness, increased swelling, fever, or any other complaints.    Past Medical History:  Diagnosis Date  . Coronary artery disease   . Hypertension   . Hypothyroidism   . Insomnia   . Pacemaker   . Thyroid disease     Patient Active Problem List   Diagnosis Date Noted  . Meningitis, aseptic 08/16/2017  . Hypertension 08/16/2017  . Vertigo 08/16/2017  . Hypothyroidism 08/16/2017  . Abnormal finding in CSF 08/12/2017  . History of atrial fibrillation 08/03/2017  . Tension-type headache, not intractable 09/24/2016  . Benign paroxysmal positional vertigo of left ear 01/04/2016  . ICD (implantable cardioverter-defibrillator) in place 01/18/2015    Past Surgical History:    Procedure Laterality Date  . ABDOMINAL HYSTERECTOMY    . cardiac ablasion    . pace maker       OB History   No obstetric history on file.      Home Medications    Prior to Admission medications   Medication Sig Start Date End Date Taking? Authorizing Provider  alendronate (FOSAMAX) 70 MG tablet Take 70 mg by mouth once a week. Take with a full glass of water on an empty stomach.    [provider]  aspirin 81 MG tablet Take 81 mg by mouth daily.    [provider]  carvedilol (COREG) 6.25 MG tablet Take 6.25 mg by mouth 2 (two) times daily with a meal.    [provider]  diltiazem (CARDIZEM CD) 240 MG 24 hr capsule Take 240 mg by mouth daily.    [provider]  DULoxetine (CYMBALTA) 60 MG capsule Take 60 mg by mouth daily.    [provider]  famotidine (PEPCID) 20 MG tablet Take 1 tablet (20 mg total) by mouth 2 (two) times daily. 12/27/17   Lawyer, Cristal Deer, PA-C  folic acid (FOLVITE) 1 MG tablet Take 1 mg by mouth daily.    [provider]  gabapentin (NEURONTIN) 300 MG capsule Take 300 mg by mouth 2 (two) times daily.    [provider]  Levothyroxine Sodium (SYNTHROID PO) Take by mouth. 50 mcg by mouth daily at  0600    [provider]  lisinopril (PRINIVIL,ZESTRIL) 2.5 MG tablet Take 2.5 mg by mouth 2 (two) times daily.    [provider]  LORAZEPAM PO Take 0.5 mg by mouth. 3 tablets (1.5 mg total) by mouth nightly    [provider]  meclizine (ANTIVERT) 25 MG tablet Take 25 mg by mouth 3 (three) times daily as needed for dizziness.    [provider]  omeprazole (PRILOSEC) 20 MG capsule Take 20 mg by mouth daily.    [provider]  predniSONE (DELTASONE) 50 MG tablet Take 1 tablet (50 mg total) by mouth daily with breakfast. 12/27/17   Lawyer, Cristal Deer, PA-C  rosuvastatin (CRESTOR) 20 MG tablet Take 20 mg by mouth daily.    [provider]  SUMAtriptan  (IMITREX) 100 MG tablet Take 100 mg by mouth every 12 (twelve) hours as needed for migraine or headache. May repeat in 2 hours if headache persists or recurs.    [provider]  thiamine 100 MG tablet Take 100 mg by mouth daily.    [provider]    Family History Family History  Problem Relation Age of Onset  . Heart disease Mother   . Heart disease Brother     Social History Social History   Tobacco Use  . Smoking status: Never Smoker  . Smokeless tobacco: Never Used  Substance Use Topics  . Alcohol use: No  . Drug use: No     Allergies   Codeine; Ivp dye [iodinated diagnostic agents]; and Sulfa antibiotics   Review of Systems Review of Systems  Constitutional: Negative for fever.  Musculoskeletal: Negative for arthralgias.  Skin: Positive for wound.  Neurological: Negative for weakness and numbness.     Physical Exam Updated Vital Signs BP (!) 119/49   Pulse 76   Temp 98.2 F (36.8 C) (Oral)   Resp 16   Ht 5\' 2"  (1.575 m)   Wt 58.9 kg   SpO2 98%   BMI 23.75 kg/m   Physical Exam Vitals signs and nursing note reviewed.  Constitutional:      General: She is not in acute distress.    Appearance: She is well-developed. She is not diaphoretic.  HENT:     Head: Normocephalic and atraumatic.  Eyes:     Conjunctiva/sclera: Conjunctivae normal.  Neck:     Musculoskeletal: Neck supple.  Cardiovascular:     Rate and Rhythm: Normal rate and regular rhythm.     Pulses: Normal pulses.          Radial pulses are 2+ on the right side and 2+ on the left side.  Pulmonary:     Effort: Pulmonary effort is normal.  Musculoskeletal:        General: No swelling or tenderness.     Comments: Patient has a posterior long-arm splint to the right arm.  The elastic bandage around the splint is loose in some places.  After the splint was removed, the skin was inspected and no noted wounds, areas of tenderness, or areas suspicious for pressure injuries were  noted. No noted erythema, increased warmth, abnormal tenderness, or swelling.  Skin:    General: Skin is warm and dry.     Capillary Refill: Capillary refill takes less than 2 seconds.     Coloration: Skin is not pale.     Comments: Sutured wound at the right proximal posterior forearm was inspected.  There are 5 sutures in place.  No swelling, erythema, tenderness,  increased warmth, or signs of dehiscence.  Neurological:     Mental Status: She is alert.     Comments: Sensation to light touch grossly intact throughout the right arm and hand. Grip strength equal bilaterally. Strength 5/5 in the bilateral wrists.  Psychiatric:        Behavior: Behavior normal.          ED Treatments / Results  Labs (all labs ordered are listed, but only abnormal results are displayed) Labs Reviewed - No data to display  EKG None  Radiology No results found.  Procedures .Suture Removal Date/Time: 02/15/2018 3:42 PM Performed by: Anselm PancoastJoy, Nissim Fleischer C, PA-C Authorized by: Anselm PancoastJoy, Kirstein Baxley C, PA-C   Consent:    Consent obtained:  Verbal   Consent given by:  Patient   Risks discussed:  Bleeding, pain and wound separation Location:    Location:  Upper extremity   Upper extremity location:  Arm   Arm location:  R lower arm Procedure details:    Wound appearance:  No signs of infection, good wound healing and clean   Number of sutures removed:  5 Post-procedure details:    Post-procedure assessment: Xeroform applied.   Patient tolerance of procedure:  Tolerated well, no immediate complications .Splint Application Date/Time: 02/15/2018 3:47 PM Performed by: Mare LoanLawson, Janet M, EMT Authorized by: Anselm PancoastJoy, Heli Dino C, PA-C   Consent:    Consent obtained:  Verbal   Consent given by:  Patient   Risks discussed:  Discoloration, numbness, pain and swelling Pre-procedure details:    Sensation:  Normal   Skin color:  Normal Procedure details:    Laterality:  Right   Location:  Arm   Arm:  R lower arm   Splint  type:  Long arm (posterior)   Supplies:  Elastic bandage, Ortho-Glass and cotton padding Post-procedure details:    Pain:  Unchanged   Sensation:  Normal   Skin color:  Normal   Patient tolerance of procedure:  Tolerated well, no immediate complications   (including critical care time)  Medications Ordered in ED Medications - No data to display   Initial Impression / Assessment and Plan / ED Course  I have reviewed the triage vital signs and the nursing notes.  Pertinent labs & imaging results that were available during my care of the patient were reviewed by me and considered in my medical decision making (see chart for details).     Patient presents for suture removal for wound sutured January 25.  There are no signs of infection.  The wound appears to be well-healed. Patient's original splint was removed to facilitate this suture removal.  She was resplinted here in the ED. Since patient was having so much trouble getting in with the orthopedic specialist she has seen in the past, Dr. Thamas JaegersLennon, I did some investigation with the referral given to her from the ED on January 25.  [facility]  However, when I went back into the Amion system to see who was on call on Jan 25, it was noted that the person on call was Jodi GeraldsJohn Graves with [facility].  Adam, RN here in the ED, called this practice and obtained an appointment for the patient for February 15 at 10:15 AM.  The patient was given instructions for home care as well as return precautions. Patient voices understanding of these instructions, accepts the plan, and is comfortable with discharge.   Final Clinical Impressions(s) / ED Diagnoses   Final  diagnoses:  Visit for suture removal    ED Discharge Orders    None       Concepcion LivingJoy, Atiba Kimberlin C, PA-C 02/15/18 1958    Concepcion LivingJoy, Lilliana Turner C, PA-C 02/15/18 2022    Alvira MondaySchlossman, Erin, MD 02/18/18 [phone]

## 2018-04-19 ENCOUNTER — Other Ambulatory Visit: Payer: Self-pay | Admitting: Internal Medicine

## 2019-04-07 LAB — HEPATIC FUNCTION PANEL
ALT: 41 — AB (ref 7–35)
AST: 25 (ref 13–35)
Alkaline Phosphatase: 140 — AB (ref 25–125)
Bilirubin, Direct: 0.2 (ref 0.01–0.4)
Bilirubin, Total: 0.9

## 2019-04-07 LAB — COMPREHENSIVE METABOLIC PANEL: Albumin: 2.7 — AB (ref 3.5–5.0)

## 2019-04-11 LAB — BASIC METABOLIC PANEL
BUN: 11 (ref 4–21)
CO2: 34 — AB (ref 13–22)
Chloride: 99 (ref 99–108)
Creatinine: 1 (ref 0.5–1.1)
Potassium: 4.3 (ref 3.4–5.3)
Sodium: 139 (ref 137–147)

## 2019-04-11 LAB — CBC AND DIFFERENTIAL
HCT: 35 — AB (ref 36–46)
Hemoglobin: 11.2 — AB (ref 12.0–16.0)
Platelets: 399 (ref 150–399)
WBC: 7.7

## 2019-04-11 LAB — CBC: RBC: 3.91 (ref 3.87–5.11)

## 2019-04-11 LAB — COMPREHENSIVE METABOLIC PANEL: Calcium: 8.9 (ref 8.7–10.7)

## 2019-04-13 ENCOUNTER — Non-Acute Institutional Stay (SKILLED_NURSING_FACILITY): Payer: Medicare Other | Admitting: Internal Medicine

## 2019-04-13 ENCOUNTER — Encounter: Payer: Self-pay | Admitting: Internal Medicine

## 2019-04-13 DIAGNOSIS — I10 Essential (primary) hypertension: Secondary | ICD-10-CM | POA: Diagnosis not present

## 2019-04-13 DIAGNOSIS — Z8679 Personal history of other diseases of the circulatory system: Secondary | ICD-10-CM | POA: Diagnosis not present

## 2019-04-13 DIAGNOSIS — I4901 Ventricular fibrillation: Secondary | ICD-10-CM

## 2019-04-13 DIAGNOSIS — R42 Dizziness and giddiness: Secondary | ICD-10-CM | POA: Diagnosis not present

## 2019-04-13 DIAGNOSIS — G44209 Tension-type headache, unspecified, not intractable: Secondary | ICD-10-CM

## 2019-04-13 MED ORDER — DULOXETINE HCL 30 MG PO CPEP
30.00 | ORAL_CAPSULE | ORAL | Status: DC
Start: 2019-04-11 — End: 2019-04-13

## 2019-04-13 MED ORDER — ATORVASTATIN CALCIUM 40 MG PO TABS
40.00 | ORAL_TABLET | ORAL | Status: DC
Start: 2019-04-11 — End: 2019-04-13

## 2019-04-13 MED ORDER — AMIODARONE HCL 200 MG PO TABS
200.00 | ORAL_TABLET | ORAL | Status: DC
Start: 2019-04-12 — End: 2019-04-13

## 2019-04-13 MED ORDER — LORAZEPAM 1 MG PO TABS
2.00 | ORAL_TABLET | ORAL | Status: DC
Start: ? — End: 2019-04-13

## 2019-04-13 MED ORDER — MECLIZINE HCL 25 MG PO TABS
25.00 | ORAL_TABLET | ORAL | Status: DC
Start: ? — End: 2019-04-13

## 2019-04-13 MED ORDER — LISINOPRIL 20 MG PO TABS
40.00 | ORAL_TABLET | ORAL | Status: DC
Start: 2019-04-12 — End: 2019-04-13

## 2019-04-13 MED ORDER — LEVOTHYROXINE SODIUM 100 MCG PO TABS
100.00 | ORAL_TABLET | ORAL | Status: DC
Start: 2019-04-12 — End: 2019-04-13

## 2019-04-13 MED ORDER — APIXABAN 5 MG PO TABS
5.00 | ORAL_TABLET | ORAL | Status: DC
Start: 2019-04-11 — End: 2019-04-13

## 2019-04-13 MED ORDER — PANTOPRAZOLE SODIUM 40 MG PO TBEC
40.00 | DELAYED_RELEASE_TABLET | ORAL | Status: DC
Start: 2019-04-12 — End: 2019-04-13

## 2019-04-13 MED ORDER — THIAMINE HCL 100 MG PO TABS
100.00 | ORAL_TABLET | ORAL | Status: DC
Start: 2019-04-12 — End: 2019-04-13

## 2019-04-13 MED ORDER — FOLIC ACID 1 MG PO TABS
1.00 | ORAL_TABLET | ORAL | Status: DC
Start: 2019-04-12 — End: 2019-04-13

## 2019-04-13 MED ORDER — CARVEDILOL 3.125 MG PO TABS
3.13 | ORAL_TABLET | ORAL | Status: DC
Start: 2019-04-11 — End: 2019-04-13

## 2019-04-13 MED ORDER — ACETAMINOPHEN 325 MG PO TABS
650.00 | ORAL_TABLET | ORAL | Status: DC
Start: ? — End: 2019-04-13

## 2019-04-13 MED ORDER — DIAZEPAM 5 MG/ML IJ SOLN
2.00 | INTRAMUSCULAR | Status: DC
Start: ? — End: 2019-04-13

## 2019-04-13 MED ORDER — HYDROCODONE-ACETAMINOPHEN 5-325 MG PO TABS
1.00 | ORAL_TABLET | ORAL | Status: DC
Start: ? — End: 2019-04-13

## 2019-04-13 MED ORDER — ONDANSETRON HCL 4 MG/2ML IJ SOLN
4.00 | INTRAMUSCULAR | Status: DC
Start: ? — End: 2019-04-13

## 2019-04-13 NOTE — Progress Notes (Signed)
Location:    [facility] Room Number: 506/P Place of Service:  SNF [phone]) Provider:  Elenora Fender, MD  Patient Care Team: Forrest Moron, MD as PCP - General (Internal Medicine)  Extended Emergency Contact Information Primary Emergency Contact: [facility] Address: [address]          [place], [place] [address] [place] Home Phone: [phone] Relation: Daughter  Code Status:  DNR Goals of care: Advanced Directive information Advanced Directives 04/13/2019  Does Patient Have a Medical Advance Directive? Yes  Type of Advance Directive Out of facility DNR (pink MOST or yellow form)  Does patient want to make changes to medical advance directive? No - Patient declined  Would patient like information on creating a medical advance directive? -  Pre-existing out of facility DNR order (yellow form or pink MOST form) Yellow form placed in chart (order not valid for inpatient use)     Chief Complaint  Patient presents with  . Hospitalization Follow-up    Hospitalization Follow Up  For ventricular tachycardia-complicated with pulmonary edema HPI:  Pt is a 85 y.o. female seen today for a hospital f/u after admission for ventricular tachycardia-as well as pulmonary edema.  She was admitted to the hospital complaining with dizziness-she does have a history of pacemaker defibrillator.  And appears interrogation showed that she was woken up in the middle the night with ventricular tachycardia.  Also some of the dizziness was caused by BP be-maneuvers were done with questionable improvement.  She was seen by Dr. Sampson Goon her cardiologist and ischemia work-up was recommended.  Her troponin was negative echo showed a preserved ejection fraction and left ventricular hypertrophy.  She developed pulmonary edema while on IV fluids her fluids were discontinued she was given IV Lasix and then switched to oral and showed  improvement.  Follow-up chest x-ray showed clearing of pulmonary edema before discharge.  In regards to V. tach amiodarone was added during her hospitalization she also had Coreg added for blood pressure control and CHF  Currently she is resting in bed comfortably does not really have any acute complaints.  She does not complain of any chest pain or shortness of breath at this time.     Past Medical History:  Diagnosis Date  . Coronary artery disease   . Hypertension   . Hypothyroidism   . Insomnia   . Pacemaker   . Thyroid disease    Past Surgical History:  Procedure Laterality Date  . ABDOMINAL HYSTERECTOMY    . cardiac ablasion    . pace maker      Allergies  Allergen Reactions  . Codeine   . Ivp Dye [Iodinated Diagnostic Agents]     rash  . Metrizamide Other (See Comments)    rash  . Sulfa Antibiotics     rash  . Sulfasalazine Other (See Comments)    rash  . Nickel Rash    Allergies as of 04/13/2019      Reactions   Codeine    Ivp Dye [iodinated Diagnostic Agents]    rash   Metrizamide Other (See Comments)   rash   Sulfa Antibiotics    rash   Sulfasalazine Other (See Comments)   rash   Nickel Rash      Medication List       Accurate as of April 13, 2019 10:34 AM. If you have any questions, ask your nurse or doctor.  STOP taking these medications   aspirin 81 MG tablet Stopped by: Edmon Crape, PA-C   diltiazem 240 MG 24 hr capsule Commonly known as: CARDIZEM CD Stopped by: Edmon Crape, PA-C   famotidine 20 MG tablet Commonly known as: PEPCID Stopped by: Edmon Crape, PA-C   gabapentin 300 MG capsule Commonly known as: NEURONTIN Stopped by: Edmon Crape, PA-C   meclizine 25 MG tablet Commonly known as: ANTIVERT Stopped by: Edmon Crape, PA-C   predniSONE 50 MG tablet Commonly known as: DELTASONE Stopped by: Edmon Crape, PA-C     TAKE these medications   acetaminophen 325 MG tablet Commonly known as: TYLENOL Take 650 mg by  mouth every 6 (six) hours as needed for mild pain or fever.   alendronate 70 MG tablet Commonly known as: FOSAMAX Take 70 mg by mouth once a week. Take with a full glass of water on an empty stomach.   amiodarone 200 MG tablet Commonly known as: PACERONE Take 200 mg by mouth daily.   carvedilol 3.125 MG tablet Commonly known as: COREG Take 3.125 mg by mouth 2 (two) times daily with a meal. What changed: Another medication with the same name was removed. Continue taking this medication, and follow the directions you see here. Changed by: Edmon Crape, PA-C   CENTRUM SILVER 50+WOMEN PO Take 1 tablet by mouth daily.   DULoxetine 30 MG capsule Commonly known as: CYMBALTA Take 30 mg by mouth at bedtime. What changed: Another medication with the same name was removed. Continue taking this medication, and follow the directions you see here. Changed by: Edmon Crape, PA-C   Eliquis 5 MG Tabs tablet Generic drug: apixaban Take 5 mg by mouth 2 (two) times daily.   folic acid 1 MG tablet Commonly known as: FOLVITE Take 1 mg by mouth daily.   levothyroxine 100 MCG tablet Commonly known as: SYNTHROID Take 100 mcg by mouth daily before breakfast. What changed: Another medication with the same name was removed. Continue taking this medication, and follow the directions you see here. Changed by: Edmon Crape, PA-C   lisinopril 40 MG tablet Commonly known as: ZESTRIL Take 40 mg by mouth daily. What changed: Another medication with the same name was removed. Continue taking this medication, and follow the directions you see here. Changed by: Edmon Crape, PA-C   LORAZEPAM PO Take 2 mg by mouth 2 (two) times daily as needed.   NON FORMULARY DIET :Regular NAS Heart healthy   omeprazole 20 MG capsule Commonly known as: PRILOSEC Take 20 mg by mouth 2 (two) times daily before a meal.   ranolazine 500 MG 12 hr tablet Commonly known as: RANEXA Take 500 mg by mouth 2 (two) times daily.    rosuvastatin 20 MG tablet Commonly known as: CRESTOR Take 20 mg by mouth daily.   SUMAtriptan 25 MG tablet Commonly known as: IMITREX Take 25 mg by mouth every 2 (two) hours as needed for migraine (Maximum 200 mg per day). May repeat in 2 hours if headache persists or recurs. What changed: Another medication with the same name was removed. Continue taking this medication, and follow the directions you see here. Changed by: Edmon Crape, PA-C   thiamine 100 MG tablet Take 100 mg by mouth daily.       Review of Systems   In general she is not complaining of any fever or chills.  Skin does not complain of rashes or itching.  Head ears eyes nose mouth and throat does not complain of  visual changes or sore throat.  Respiratory does not complain of being short of breath or having a cough.  Cardiac no complaints of chest pain she does have mild lower extremity edema.  GI is not complaining of abdominal discomfort at this time nausea vomiting diarrhea constipation.  GU no complaints of dysuria.  Musculoskeletal has some weakness but is not complaining of pain at this point.  Neurologic positive for previous history of dizziness she is currently not complaining of that lying in bed.  Does not complain of any headache or syncopal type feelings.  And psych is not complaining of being depressed or anxious I do note she is on Cymbalta.    Immunization History  Administered Date(s) Administered  . Influenza-Unspecified 09/22/2017  . Tdap 01/30/2018   Pertinent  Health Maintenance Due  Topic Date Due  . DEXA SCAN  Never done  . PNA vac Low Risk Adult (1 of 2 - PCV13) Never done  . INFLUENZA VACCINE  08/07/2019   No flowsheet data found. Functional Status Survey:    Vitals:   04/13/19 0930  BP: (!) 159/89  Pulse: (!) 104  Resp: 18  Temp: 98.2 F (36.8 C)  TempSrc: Oral  SpO2: 91%  Weight: 148 lb 12.8 oz (67.5 kg)  Height: 5\' 2"  (1.575 m)  Previous blood pressure  140/73 Body mass index is 27.22 kg/m. Physical Exam General this is a pleasant elderly female no distress lying comfortably in bed.  Her skin is warm and dry.  Eyes visual acuity appears to be intact sclera and conjunctive are clear.  Chest is clear to auscultation there is no labored breathing.  Heart is irregular irregular rate and rhythm in the 90s-she has mild bilateral lower extremity edema.  Abdomen is soft nontender with positive bowel sounds.  Musculoskeletal Limited exam since she is in bed but is able to move all extremities x4 it appears at relative baseline.  Neurologic appears grossly intact without lateralizing findings her speech is clear.  Psych she is pleasant and appropriate.   Labs reviewed: Recent Labs    04/11/19 0000  NA 139  K 4.3  CL 99  CO2 34*  BUN 11  CREATININE 1.0  CALCIUM 8.9   Recent Labs    04/07/19 0000  AST 25  ALT 41*  ALKPHOS 140*  ALBUMIN 2.7*   Recent Labs    04/11/19 0000  WBC 7.7  HGB 11.2*  HCT 35*  PLT 399   No results found for: TSH No results found for: HGBA1C No results found for: CHOL, HDL, LDLCALC, LDLDIRECT, TRIG, CHOLHDL  Significant Diagnostic Results in last 30 days:  No results found.  Assessment/Plan  #1 history of V. tach-dizziness-this apparently resolved she was evaluated by cardiology with recommendation for followed by Dr. 06/11/19.  Amiodarone was added 200 mg p.o. daily-Coreg was also added for CHF   #2 dizziness it was thought there was some element of BP the to this maneuvers were done with apparently questionable improvement-at this point will monitor  #3-history of pulmonary do not thought to IV fluids-fluids were discontinued she was given IV Lasix and then 80 mg of oral Lasix.  This apparently improved with chest x-ray showing resolution of the pulmonary edema-.  Coreg was added for concerns of CHF.  At this point will monitor weights.  4.  History of coronary artery disease  she is on Ranexa 500 mg twice a day-she is also on Crestor 20 mg nightly in addition to Coreg  3.125 mg a day.  5.  Hypertension at this point will monitor recent systolics appear to be in the 140-150 range-would like to get more readings before making any medication adjustments-she is on lisinopril 40 mg a day and Coreg 3.125 mg twice daily  #6 depression at this point appears to be stable she is on Cymbalta 30 mg a day.  7.  History of hypothyroidism not stated as uncontrolled she is on Synthroid 100 mcg daily.  8.  History of atrial fibrillation she is status post ablation appears in the past she is on Eliquis 5 mg twice daily as well as Coreg 3.125 mg twice daily at this point will monitor.  9.  History of hyperlipidemia not stated as uncontrolled again she is on Crestor 20 mg nightly.  10.  History of anxiety she is on Ativan 2 mg every 8 hours as needed at this point appears to be stable.  11.  History of migraine she does have as needed Imitrex daily if needed.  We will update lab work including a CBC metabolic panel on April 18, 2019.  [id number] note greater than 40 minutes spent assessing patient-reviewing her chart and labs-coordinating and formulating a plan of care for numerous diagnoses-of note greater than 50% of time spent coordinating a plan of care with input as noted above

## 2019-04-15 ENCOUNTER — Encounter: Payer: Self-pay | Admitting: Internal Medicine

## 2019-04-15 ENCOUNTER — Non-Acute Institutional Stay (SKILLED_NURSING_FACILITY): Payer: Medicare Other | Admitting: Internal Medicine

## 2019-04-15 DIAGNOSIS — I25118 Atherosclerotic heart disease of native coronary artery with other forms of angina pectoris: Secondary | ICD-10-CM

## 2019-04-15 DIAGNOSIS — E785 Hyperlipidemia, unspecified: Secondary | ICD-10-CM | POA: Insufficient documentation

## 2019-04-15 DIAGNOSIS — I472 Ventricular tachycardia, unspecified: Secondary | ICD-10-CM | POA: Insufficient documentation

## 2019-04-15 DIAGNOSIS — I480 Paroxysmal atrial fibrillation: Secondary | ICD-10-CM

## 2019-04-15 DIAGNOSIS — I2510 Atherosclerotic heart disease of native coronary artery without angina pectoris: Secondary | ICD-10-CM | POA: Insufficient documentation

## 2019-04-15 DIAGNOSIS — E034 Atrophy of thyroid (acquired): Secondary | ICD-10-CM | POA: Diagnosis not present

## 2019-04-15 DIAGNOSIS — I5023 Acute on chronic systolic (congestive) heart failure: Secondary | ICD-10-CM | POA: Diagnosis not present

## 2019-04-15 DIAGNOSIS — I4901 Ventricular fibrillation: Secondary | ICD-10-CM | POA: Insufficient documentation

## 2019-04-15 DIAGNOSIS — Z95810 Presence of automatic (implantable) cardiac defibrillator: Secondary | ICD-10-CM

## 2019-04-15 NOTE — Progress Notes (Signed)
: Provider:  Noah Delaine. Whitaker Holderman MD Location:  [facility] Room Number: 009/Q Place of Service:  SNF ([phone])  PCP: Charleston Poot, MD Patient Care Team: Charleston Poot, MD as PCP - General (Internal Medicine)  Extended Emergency Contact Information Primary Emergency Contact: [facility] Address: [address]          [place], [place] [address] [place] Phone: [phone] Relation: Daughter     Allergies: Codeine, Ivp dye [iodinated diagnostic agents], Metrizamide, Sulfa antibiotics, Sulfasalazine, and Nickel  Chief Complaint  Patient presents with  . New Admit To SNF    New Admission Visit    HPI: Patient is 85 y.o. female fibrillation on Eliquis, vertigo, orthostatic hypotension, gait imbalance, chronic back pain, CAD, recently admitted to this facility for UTI and discharged on 3/28 who presented to [facility] with dizziness and anxiety..  Patient has word finding difficulty which is chronic due to her prior concussion and diplopia and chronic dysphagia.  In the ED her vitals were normal, head CT was negative for acute findings and potassium 2.8.  All else was normal and patient was admitted to [facility] from 3/31-4/5 for acute metabolic encephalopathy and ongoing dizziness.  Pacemaker/defibrillator interrogation revealed she had had ventricular tachycardia.  Patient was seen by electrophysiology and ischemic work-up was recommended and was negative.  Echo showed preserved ejection fraction with LVH and right ventricular markedly dilated.  Patient developed pulmonary edema with IV fluids and was treated with IV Lasix.  Amiodarone was added this hospital stay for ventricular arrhythmia as well as Coreg.  Patient is admitted to skilled nursing facility for OT/PT.  While at skilled nursing facility patient will be followed for hypothyroidism treated with Synthroid, hyperlipidemia treated with Crestor and  coronary artery disease treated with Ranexa and Coreg.  Past Medical History:  Diagnosis Date  . Coronary artery disease   . Hypertension   . Hypothyroidism   . Insomnia   . Pacemaker   . Thyroid disease     Past Surgical History:  Procedure Laterality Date  . ABDOMINAL HYSTERECTOMY    . cardiac ablasion    . pace maker      Allergies as of 04/15/2019      Reactions   Codeine    Ivp Dye [iodinated Diagnostic Agents]    rash   Metrizamide Other (See Comments)   rash   Sulfa Antibiotics    rash   Sulfasalazine Other (See Comments)   rash   Nickel Rash      Medication List       Accurate as of April 15, 2019  9:13 PM. If you have any questions, ask your nurse or doctor.        acetaminophen 325 MG tablet Commonly known as: TYLENOL Take 650 mg by mouth every 6 (six) hours as needed for mild pain or fever.   alendronate 70 MG tablet Commonly known as: FOSAMAX Take 70 mg by mouth once a week. Take with a full glass of water on an empty stomach.   amiodarone 200 MG tablet Commonly known as: PACERONE Take 200 mg by mouth daily.   carvedilol 3.125 MG tablet Commonly known as: COREG Take 3.125 mg by mouth 2 (two) times daily with a meal.   CENTRUM SILVER 50+WOMEN PO Take 1 tablet by mouth daily.   DULoxetine 30 MG capsule Commonly known as: CYMBALTA Take 30 mg by mouth at bedtime.  Eliquis 5 MG Tabs tablet Generic drug: apixaban Take 5 mg by mouth 2 (two) times daily.   folic acid 1 MG tablet Commonly known as: FOLVITE Take 1 mg by mouth daily.   levothyroxine 100 MCG tablet Commonly known as: SYNTHROID Take 100 mcg by mouth daily before breakfast.   lisinopril 40 MG tablet Commonly known as: ZESTRIL Take 40 mg by mouth daily.   LORAZEPAM PO Take 2 mg by mouth 2 (two) times daily as needed.   NON FORMULARY DIET :Regular NAS Heart healthy   omeprazole 20 MG capsule Commonly known as: PRILOSEC Take 20 mg by mouth 2 (two) times daily before a  meal.   ranolazine 500 MG 12 hr tablet Commonly known as: RANEXA Take 500 mg by mouth 2 (two) times daily.   rosuvastatin 20 MG tablet Commonly known as: CRESTOR Take 20 mg by mouth daily.   SUMAtriptan 25 MG tablet Commonly known as: IMITREX Take 25 mg by mouth every 2 (two) hours as needed for migraine (Maximum 200 mg per day). May repeat in 2 hours if headache persists or recurs.   thiamine 100 MG tablet Take 100 mg by mouth daily.       No orders of the defined types were placed in this encounter.   Immunization History  Administered Date(s) Administered  . Influenza-Unspecified 09/22/2017  . Tdap 01/30/2018    Social History   Tobacco Use  . Smoking status: Never Smoker  . Smokeless tobacco: Never Used  Substance Use Topics  . Alcohol use: No    Family history is   Family History  Problem Relation Age of Onset  . Heart disease Mother   . Heart disease Brother       Review of Systems  Per nursing-blood pressure 150/85-second shift and 130- 140 on her shift with heart rate often greater than 100 GENERAL:  no fevers, fatigue, appetite changes SKIN: No itching, or rash EYES: No eye pain, redness, discharge EARS: No earache, tinnitus, change in hearing NOSE: No congestion, drainage or bleeding  MOUTH/THROAT: No mouth or tooth pain, No sore throat RESPIRATORY: No cough, wheezing, SOB CARDIAC: No chest pain, palpitations, lower extremity edema  GI: No abdominal pain, No N/V/D or constipation, No heartburn or reflux  GU: No dysuria, frequency or urgency, or incontinence  MUSCULOSKELETAL: No unrelieved bone/joint pain NEUROLOGIC: No headache, dizziness or focal weakness PSYCHIATRIC: No c/o anxiety or sadness   Vitals:   04/15/19 1338  BP: 129/79  Pulse: 98  Resp: 18  Temp: 98 F (36.7 C)  SpO2: 91%    SpO2 Readings from Last 1 Encounters:  04/15/19 91%   Body mass index is 25.3 kg/m.     Physical Exam  GENERAL APPEARANCE: Alert,  conversant,  No acute distress.  SKIN: No diaphoresis rash HEAD: Normocephalic, atraumatic  EYES: Conjunctiva/lids clear. Pupils round, reactive. EOMs intact.  EARS: External exam WNL, canals clear. Hearing grossly normal.  NOSE: No deformity or discharge.  MOUTH/THROAT: Lips w/o lesions  RESPIRATORY: Breathing is even, unlabored. Lung sounds are clear   CARDIOVASCULAR: Heart irregular no murmurs, rubs or gallops. No peripheral edema.   GASTROINTESTINAL: Abdomen is soft, non-tender, not distended w/ normal bowel sounds. GENITOURINARY: Bladder non tender, not distended  MUSCULOSKELETAL: No abnormal joints or musculature NEUROLOGIC:  Cranial nerves 2-12 grossly intact. Moves all extremities  PSYCHIATRIC: Mood and affect appropriate to situation, no behavioral issues  Patient Active Problem List   Diagnosis Date Noted  . Ventricular fibrillation (HCC) 04/15/2019  .  Meningitis, aseptic 08/16/2017  . Hypertension 08/16/2017  . Vertigo 08/16/2017  . Hypothyroidism 08/16/2017  . Abnormal finding in CSF 08/12/2017  . History of atrial fibrillation 08/03/2017  . Tension-type headache, not intractable 09/24/2016  . Benign paroxysmal positional vertigo of left ear 01/04/2016  . ICD (implantable cardioverter-defibrillator) in place 01/18/2015      Labs reviewed: Basic Metabolic Panel:    Component Value Date/Time   NA 139 04/11/2019 0000   K 4.3 04/11/2019 0000   CL 99 04/11/2019 0000   CO2 34 (A) 04/11/2019 0000   GLUCOSE 97 12/26/2015 1805   BUN 11 04/11/2019 0000   CREATININE 1.0 04/11/2019 0000   CREATININE 0.99 12/26/2015 1805   CALCIUM 8.9 04/11/2019 0000   PROT 6.4 (L) 12/26/2015 1805   ALBUMIN 2.7 (A) 04/07/2019 0000   AST 25 04/07/2019 0000   ALT 41 (A) 04/07/2019 0000   ALKPHOS 140 (A) 04/07/2019 0000   BILITOT 1.1 12/26/2015 1805   GFRNONAA 53 (L) 12/26/2015 1805   GFRAA >60 12/26/2015 1805    Recent Labs    04/11/19 0000  NA 139  K 4.3  CL 99  CO2 34*  BUN  11  CREATININE 1.0  CALCIUM 8.9   Liver Function Tests: Recent Labs    04/07/19 0000  AST 25  ALT 41*  ALKPHOS 140*  ALBUMIN 2.7*   No results for input(s): LIPASE, AMYLASE in the last 8760 hours. No results for input(s): AMMONIA in the last 8760 hours. CBC: Recent Labs    04/11/19 0000  WBC 7.7  HGB 11.2*  HCT 35*  PLT 399   Lipid No results for input(s): CHOL, HDL, LDLCALC, TRIG in the last 8760 hours.  Cardiac Enzymes: No results for input(s): CKTOTAL, CKMB, CKMBINDEX, TROPONINI in the last 8760 hours. BNP: No results for input(s): BNP in the last 8760 hours. No results found for: MICROALBUR No results found for: HGBA1C No results found for: TSH No results found for: VITAMINB12 No results found for: FOLATE No results found for: IRON, TIBC, FERRITIN  Imaging and Procedures obtained prior to SNF admission: No results found.   Not all labs, radiology exams or other studies done during hospitalization come through on my EPIC note; however they are reviewed by me.    Assessment and Plan  Ventricular tachycardia/atrial fibrillation -seen on pacemaker/defibrillator interrogation; ischemic work-up negative with the exception of left ventricular hypertrophy with preserved ejection fraction and dilated right ventricle SNF-admitted for OT/PT; continue amiodarone 200 mg daily that was started in the hospital and with elevated blood pressure and elevated pulse will increase Coreg from 3.125 to 6.25 mg twice daily and if blood pressure holds will increase it again to 12.5 mg twice daily on Monday; continue Eliquis 5 mg twice daily that was increased in hospital  Acute on chronic systolic congestive heart failure-iatrogenic; treated with IV Lasix and oral Lasix with BNP normalizing chest x-ray showing clearing of pulmonary edema at the time of discharge and patient on room air at the time of discharge  Hypothyroidism SNF-not stated as uncontrolled; continue Synthroid 100 mcg  daily  Hyperlipidemia SNF-not stated as uncontrolled; continue Crestor 20 mg daily  Coronary artery disease SNF-no chest pain or equivalent; continue Coreg 3.125 mg twice daily and Ranexa 500 mg twice daily    Chest pain greater than 45 minutes;> 50% of time with patient was spent reviewing records, labs, tests and studies, counseling and developing plan of care  Margit Hanks, MD

## 2019-04-27 ENCOUNTER — Other Ambulatory Visit: Payer: Self-pay | Admitting: Internal Medicine

## 2019-04-27 ENCOUNTER — Other Ambulatory Visit: Payer: Self-pay

## 2019-04-27 MED ORDER — LORAZEPAM 2 MG PO TABS: 2.0000 mg | ORAL_TABLET | Freq: Every evening | ORAL | 0 refills | Status: DC | PRN

## 2019-05-04 ENCOUNTER — Encounter: Payer: Self-pay | Admitting: Internal Medicine

## 2019-05-04 ENCOUNTER — Non-Acute Institutional Stay (SKILLED_NURSING_FACILITY): Payer: Medicare Other | Admitting: Internal Medicine

## 2019-05-04 DIAGNOSIS — I25118 Atherosclerotic heart disease of native coronary artery with other forms of angina pectoris: Secondary | ICD-10-CM | POA: Diagnosis not present

## 2019-05-04 DIAGNOSIS — I472 Ventricular tachycardia, unspecified: Secondary | ICD-10-CM

## 2019-05-04 DIAGNOSIS — E034 Atrophy of thyroid (acquired): Secondary | ICD-10-CM

## 2019-05-04 DIAGNOSIS — I5023 Acute on chronic systolic (congestive) heart failure: Secondary | ICD-10-CM | POA: Diagnosis not present

## 2019-05-04 DIAGNOSIS — I480 Paroxysmal atrial fibrillation: Secondary | ICD-10-CM

## 2019-05-04 DIAGNOSIS — I10 Essential (primary) hypertension: Secondary | ICD-10-CM

## 2019-05-04 MED ORDER — APIXABAN 5 MG PO TABS: 5.0000 mg | ORAL_TABLET | Freq: Two times a day (BID) | ORAL | 0 refills | Status: AC

## 2019-05-04 MED ORDER — LORAZEPAM 2 MG PO TABS: 2.0000 mg | ORAL_TABLET | Freq: Every evening | ORAL | 0 refills | Status: AC | PRN

## 2019-05-04 MED ORDER — SUMATRIPTAN SUCCINATE 25 MG PO TABS: ORAL_TABLET | ORAL | 0 refills | Status: AC

## 2019-05-04 MED ORDER — LEVOTHYROXINE SODIUM 100 MCG PO TABS: 100.0000 ug | ORAL_TABLET | Freq: Every day | ORAL | 0 refills | Status: AC

## 2019-05-04 MED ORDER — DULOXETINE HCL 30 MG PO CPEP: 30.0000 mg | ORAL_CAPSULE | Freq: Every evening | ORAL | 0 refills | Status: AC

## 2019-05-04 MED ORDER — OMEPRAZOLE 20 MG PO CPDR: 20.0000 mg | DELAYED_RELEASE_CAPSULE | Freq: Two times a day (BID) | ORAL | 0 refills | Status: AC

## 2019-05-04 MED ORDER — CARVEDILOL 6.25 MG PO TABS: 6.2500 mg | ORAL_TABLET | Freq: Two times a day (BID) | ORAL | 0 refills | Status: AC

## 2019-05-04 MED ORDER — LISINOPRIL 40 MG PO TABS: 40.0000 mg | ORAL_TABLET | Freq: Every day | ORAL | 0 refills | Status: AC

## 2019-05-04 MED ORDER — RANOLAZINE ER 500 MG PO TB12: 500.0000 mg | ORAL_TABLET | Freq: Two times a day (BID) | ORAL | 0 refills | Status: AC

## 2019-05-04 MED ORDER — ROSUVASTATIN CALCIUM 20 MG PO TABS: 20.0000 mg | ORAL_TABLET | Freq: Every day | ORAL | 0 refills | Status: AC

## 2019-05-04 MED ORDER — ALENDRONATE SODIUM 70 MG PO TABS: 70.0000 mg | ORAL_TABLET | ORAL | 0 refills | Status: AC

## 2019-05-04 MED ORDER — AMIODARONE HCL 200 MG PO TABS: 200.0000 mg | ORAL_TABLET | Freq: Every day | ORAL | 0 refills | Status: AC

## 2019-05-04 NOTE — Progress Notes (Signed)
Location:    [facility] Room Number: 100/P Place of Service:  SNF (31)  Provider: Estill Batten  PCP: Forrest Moron, MD Patient Care Team: Forrest Moron, MD as PCP - General (Internal Medicine)  Extended Emergency Contact Information Primary Emergency Contact: [facility] Address: [address]          [place], [place] [address] [place] Home Phone: [phone] Relation: Daughter  Code Status: DNR Goals of care:  Advanced Directive information Advanced Directives 05/04/2019  Does Patient Have a Medical Advance Directive? Yes  Type of Advance Directive Out of facility DNR (pink MOST or yellow form)  Does patient want to make changes to medical advance directive? No - Patient declined  Would patient like information on creating a medical advance directive? -  Pre-existing out of facility DNR order (yellow form or pink MOST form) Yellow form placed in chart (order not valid for inpatient use)     Allergies  Allergen Reactions  . Codeine   . Ivp Dye [Iodinated Diagnostic Agents]     rash  . Metrizamide Other (See Comments)    rash  . Sulfa Antibiotics     rash  . Sulfasalazine Other (See Comments)    rash  . Nickel Rash    Chief Complaint  Patient presents with  . Discharge Note    Discharge Visit    HPI:  85 y.o. female seen today for discharge from facility slated for tomorrow May 05, 2019. She was admitted here for PT and OT after hospitalization for what was diagnosed as ventricular tachycardia.  She presented to the hospital with increased confusion and ongoing dizziness.  Pacemaker-defibrillator interrogation revealed she had ventricular tachycardia.  Ischemic work-up was recommended and was negative.  Cardiac echo showed preserved ejection fraction with left ventricular hypertrophy and right ventricular markedly dilated.  She developed pulmonary edema since she got IV fluids and was treated with  IV Lasix.  Amiodarone was added for her ventricular arrhythmia she also was started on Coreg.  Her Coreg was increased during her stay here secondary to some tachycardia and this appears to be effective she is on Coreg 6.25 mg twice daily currently.  Her other medical issues appear to be stable she does have a history of hypothyroidism on Synthroid-hyperlipidemia which is treated with Crestor-and coronary artery disease with for which she is on Coreg as well as Ranexa.  Currently she is lying in bed comfortably does not have any acute complaints she had a short choking episode this morning does not really complain of any acute shortness of breath.  Nursing does not really report any issues.  She is looking forward to going home she will need PT OT and nursing-she will be going home with her daughter.  She also will be seen by home health---[facility]---     Past Medical History:  Diagnosis Date  . Coronary artery disease   . Hypertension   . Hypothyroidism   . Insomnia   . Pacemaker   . Thyroid disease     Past Surgical History:  Procedure Laterality Date  . ABDOMINAL HYSTERECTOMY    . cardiac ablasion    . pace maker        reports that she has never smoked. She has never used smokeless tobacco. She reports that she does not drink alcohol or use drugs. Social History   Socioeconomic History  . Marital status: Widowed    Spouse name: Not on file  .  Number of children: Not on file  . Years of education: Not on file  . Highest education level: Not on file  Occupational History  . Not on file  Tobacco Use  . Smoking status: Never Smoker  . Smokeless tobacco: Never Used  Substance and Sexual Activity  . Alcohol use: No  . Drug use: No  . Sexual activity: Not on file  Other Topics Concern  . Not on file  Social History Narrative  . Not on file   Social Determinants of Health   Financial Resource Strain:   . Difficulty of Paying Living Expenses:   Food  Insecurity:   . Worried About Charity fundraiser in the Last Year:   . Arboriculturist in the Last Year:   Transportation Needs:   . Film/video editor (Medical):   Marland Kitchen Lack of Transportation (Non-Medical):   Physical Activity:   . Days of Exercise per Week:   . Minutes of Exercise per Session:   Stress:   . Feeling of Stress :   Social Connections:   . Frequency of Communication with Friends and Family:   . Frequency of Social Gatherings with Friends and Family:   . Attends Religious Services:   . Active Member of Clubs or Organizations:   . Attends Archivist Meetings:   Marland Kitchen Marital Status:   Intimate Partner Violence:   . Fear of Current or Ex-Partner:   . Emotionally Abused:   Marland Kitchen Physically Abused:   . Sexually Abused:    Functional Status Survey:    Allergies  Allergen Reactions  . Codeine   . Ivp Dye [Iodinated Diagnostic Agents]     rash  . Metrizamide Other (See Comments)    rash  . Sulfa Antibiotics     rash  . Sulfasalazine Other (See Comments)    rash  . Nickel Rash    Pertinent  Health Maintenance Due  Topic Date Due  . DEXA SCAN  Never done  . PNA vac Low Risk Adult (1 of 2 - PCV13) Never done  . INFLUENZA VACCINE  08/07/2019    Medications: Allergies as of 05/04/2019      Reactions   Codeine    Ivp Dye [iodinated Diagnostic Agents]    rash   Metrizamide Other (See Comments)   rash   Sulfa Antibiotics    rash   Sulfasalazine Other (See Comments)   rash   Nickel Rash      Medication List       Accurate as of May 04, 2019  9:58 AM. If you have any questions, ask your nurse or doctor.        acetaminophen 325 MG tablet Commonly known as: TYLENOL Take 650 mg by mouth every 6 (six) hours as needed for mild pain or fever.   alendronate 70 MG tablet Commonly known as: FOSAMAX Take 70 mg by mouth once a week. Take with a full glass of water on an empty stomach.   amiodarone 200 MG tablet Commonly known as: PACERONE Take  200 mg by mouth daily.   carvedilol 6.25 MG tablet Commonly known as: COREG Take 6.25 mg by mouth 2 (two) times daily with a meal. For Tachycardia What changed: Another medication with the same name was removed. Continue taking this medication, and follow the directions you see here. Changed by: Granville Lewis, PA-C   CENTRUM SILVER 50+WOMEN PO Take 1 tablet by mouth daily.   DULoxetine 30 MG capsule Commonly  known as: CYMBALTA Take 30 mg by mouth at bedtime.   Eliquis 5 MG Tabs tablet Generic drug: apixaban Take 5 mg by mouth 2 (two) times daily.   folic acid 1 MG tablet Commonly known as: FOLVITE Take 1 mg by mouth daily.   levothyroxine 100 MCG tablet Commonly known as: SYNTHROID Take 100 mcg by mouth daily before breakfast.   lisinopril 40 MG tablet Commonly known as: ZESTRIL Take 40 mg by mouth daily.   LORazepam 2 MG tablet Commonly known as: Ativan Take 1 tablet (2 mg total) by mouth at bedtime as needed for anxiety.   NON FORMULARY DIET :Regular NAS Heart healthy   omeprazole 20 MG capsule Commonly known as: PRILOSEC Take 20 mg by mouth 2 (two) times daily before a meal.   ranolazine 500 MG 12 hr tablet Commonly known as: RANEXA Take 500 mg by mouth 2 (two) times daily.   rosuvastatin 20 MG tablet Commonly known as: CRESTOR Take 20 mg by mouth daily.   SUMAtriptan 25 MG tablet Commonly known as: IMITREX GIVE 1 TABLE AS NEEDED BY MOUTH WAITING 2 HOURS BETWEEN DOSES FOR MIGRAINE MAXIMUM 200 MG /DAY   thiamine 100 MG tablet Take 100 mg by mouth daily.       Review of Systems   In general she is not complaining of any fever chills.  Skin does not complain of rashes or itching or diaphoresis.  Head ears eyes nose mouth and throat is not complain of visual changes or sore throat.  Respiratory does not complain of any acute shortness of breath-says she gets a little short of breath at times-says she did have a choking episode earlier today.  Cardiac  does not complain of chest pain has mild lower extremity edema per nursing this is baseline.  GI is not complaining of abdominal pain nausea vomiting diarrhea constipation.  GU no complaints of dysuria.  Musculoskeletal at this point is not complaining of any pain she does have some lower extremity weakness.  Neurologic does not complain at this time of dizziness headache or syncope.-Does have history of migraines but apparently this is been stable during her stay here  And psych does not complain of being depressed or anxious at this point-she does have a history of some nighttime anxiety and continues on lorazepam.      Vitals:   05/04/19 0949  BP: (!) 144/72  Pulse: 76  Resp: 18  Temp: 97.9 F (36.6 C)  TempSrc: Oral  SpO2: 91%  Weight: 141 lb 6.4 oz (64.1 kg)  Height: 5\' 2"  (1.575 m)   Body mass index is 25.86 kg/m. Physical Exam In general this is a well-nourished elderly female in no distress lying comfortably in bed.  Her skin is warm and dry.  Eyes visual acuity appears to be intact sclera and conjunctive are clear.  Oropharynx is clear mucous membranes moist.   Chest is clear to auscultation cannot really appreciate any labored breathing- . Heart is regular rate and rhythm with an occasional irregular beat-she has trace  lower extremity edema.  Abdomen is obese soft nontender with positive bowel sounds.  Musculoskeletal is able to move all extremities x4 it appears at relative baseline-she does use a walker.  Neurologic appears grossly intact cannot appreciate lateralizing findings her speech is clear.  Psych she is largely alert and oriented pleasant and appropriate.       Labs reviewed:  April 18, 2019.  WBC 5.9 hemoglobin 10.6 platelets 335.  Sodium 139  potassium 4.3 BUN 17.8 creatinine 1.0.   Basic Metabolic Panel: Recent Labs    04/11/19 0000  NA 139  K 4.3  CL 99  CO2 34*  BUN 11  CREATININE 1.0  CALCIUM 8.9   Liver Function  Tests: Recent Labs    04/07/19 0000  AST 25  ALT 41*  ALKPHOS 140*  ALBUMIN 2.7*   No results for input(s): LIPASE, AMYLASE in the last 8760 hours. No results for input(s): AMMONIA in the last 8760 hours. CBC: Recent Labs    04/11/19 0000  WBC 7.7  HGB 11.2*  HCT 35*  PLT 399   Cardiac Enzymes: No results for input(s): CKTOTAL, CKMB, CKMBINDEX, TROPONINI in the last 8760 hours. BNP: Invalid input(s): POCBNP CBG: No results for input(s): GLUCAP in the last 8760 hours.  Procedures and Imaging Studies During Stay: No results found.  Assessment/Plan:    #1 history of ventricular tachycardia-atrial fibrillation-she is on amiodarone 200 mg a day-currently on Coreg 6.25 mg twice daily-this was increased during her stay here secondary to somewhat elevated blood pressure and tachycardia-this has shown improvement it appears.  She is also on Eliquis 5 mg twice daily for anticoagulation.  At this point appears stable.--- She will have follow-up by Dr. Sampson GoonFitzgerald her cardiologist  2.  History of acute on chronic systolic CHF-apparently received IV Lasix in the hospital and oral Lasix.  BNP did show improvement-chest x-ray showed clearing of pulmonary edema at time of hospital discharge.  She does continue on Coreg 6.25 mg twice daily   At this point appears stable but she does complain of some intermittent shortness of breath but indicates this is not really new.  However will order chest x-ray-also because of the choking episode would like to rule out aspiration.  3.  History of coronary artery disease this is been stable it appears during her stay here without complaints of chest pain ---she is on Ranexa 500 mg twice daily-as well as Eliquis 5 mg twice daily-on Crestor 20 mg a day.  4.  History of hypothyroidism-not stated as uncontrolled she is on Synthroid 100 mcg a day-will defer follow-up to primary care provider.  5.  History of hyperlipidemia not stated as uncontrolled  again will warrant follow-up by primary care provider she is on Crestor 20 mg a day.  6.  History of anxiety she is on lorazepam 2 mg p.o. nightly this appears to be well-tolerated and effective.  7.  History of migraine she does have orders for sumatriptan 25 mg 1 tab as needed-must have at least 2 hours between dosing.  This appears to have been stable here in and not aware of any migraine complaints.  8.-History of depression she is on Cymbalta 30 mg nightly this appears to be relatively stable.  9.  History of osteoporosis she is on alendronate 70 mg q. weekly.  10.-Hypertension-continues have some variable readings-systolic is in the 140s today but at times is somewhat lower and at times spikes a bit higher but I do not see consistent elevations or significant depressions-will defer to primary care provider for follow-up she continues on Coreg 6.25 mg twice daily again this was increased during her stay here because  blood pressures were elevated-she also is on lisinopril 40 mg a day  Again she will be going home with her daughter she will need continued PT OT and nursing-as well as labs--CBC-BMP-drawn by home health with primary care provider notified of results-her primary care provider is Dr. Benay PillowStephen Ruehle-phone  number [phone]  Home health will be provided by [facility]--phone number [phone]   [id number]  greater than 30 minutes spent on this discharge summary-greater than 50% of time spent coordinating and formulating plan of care for numerous diagnoses

## 2019-05-05 ENCOUNTER — Encounter: Payer: Self-pay | Admitting: Internal Medicine

## 2019-05-05 ENCOUNTER — Non-Acute Institutional Stay (SKILLED_NURSING_FACILITY): Payer: Medicare Other | Admitting: Internal Medicine

## 2019-05-05 DIAGNOSIS — J90 Pleural effusion, not elsewhere classified: Secondary | ICD-10-CM

## 2019-05-05 DIAGNOSIS — I5023 Acute on chronic systolic (congestive) heart failure: Secondary | ICD-10-CM | POA: Diagnosis not present

## 2019-05-05 NOTE — Progress Notes (Signed)
Location:    [facility] Room Number: 100/P Place of Service:  SNF [phone]) Provider:  Arbie Cookey, MD  Patient Care Team: Forrest Moron, MD as PCP - General (Internal Medicine)  Extended Emergency Contact Information Primary Emergency Contact: [facility] Address: [address]          [place], [place] [address] [place] of [place] Home Phone: [phone] Relation: Daughter  Code Status:  DNR Goals of care: Advanced Directive information Advanced Directives 05/05/2019  Does Patient Have a Medical Advance Directive? Yes  Type of Advance Directive Out of facility DNR (pink MOST or yellow form)  Does patient want to make changes to medical advance directive? No - Patient declined  Would patient like information on creating a medical advance directive? -  Pre-existing out of facility DNR order (yellow form or pink MOST form) Yellow form placed in chart (order not valid for inpatient use)    Chief complaint-acute visit follow-up chest x-ray showing mild left lung pleural effusion   HPI:  Pt is a 85 y.o. female seen today for an acute visit for chest x-ray which shows a mild left pleural effusion-per x-ray ray report there is no acute pulmonary abnormality but it did note a mild left pleural effusion.  X-ray was ordered because patient had complained at times of some intermittent shortness of breath and had a choking episode--since she was slated for discharge today we wanted to rule out aspiration--.  However she does have a listed history of CHF-and did require Lasix at one point in the hospital-this was thought secondary to IV fluid she received in the hospital.  Clinically she appears to be stable today with O2 sats in the 90s on room air-  Patient has been here for short-term rehab after hospitalization for increased confusion-weakness-she was found to have ventricular tachycardia and she is now on amiodarone as well as Coreg  and appears to be doing well.  She will be going home with her daughter will need continued PT OT and nursing-for additional details please see discharge note dated May 04, 2019   Past Medical History:  Diagnosis Date  . Coronary artery disease   . Hypertension   . Hypothyroidism   . Insomnia   . Pacemaker   . Thyroid disease    Past Surgical History:  Procedure Laterality Date  . ABDOMINAL HYSTERECTOMY    . cardiac ablasion    . pace maker      Allergies  Allergen Reactions  . Codeine   . Ivp Dye [Iodinated Diagnostic Agents]     rash  . Metrizamide Other (See Comments)    rash  . Sulfa Antibiotics     rash  . Sulfasalazine Other (See Comments)    rash  . Nickel Rash    Outpatient Encounter Medications as of 05/05/2019  Medication Sig  . acetaminophen (TYLENOL) 325 MG tablet Take 650 mg by mouth every 6 (six) hours as needed for mild pain or fever.  Marland Kitchen alendronate (FOSAMAX) 70 MG tablet Take 1 tablet (70 mg total) by mouth once a week. Take with a full glass of water on an empty stomach.  Marland Kitchen amiodarone (PACERONE) 200 MG tablet Take 1 tablet (200 mg total) by mouth daily.  Marland Kitchen apixaban (ELIQUIS) 5 MG TABS tablet Take 1 tablet (5 mg total) by mouth 2 (two) times daily.  . carvedilol (COREG) 6.25 MG tablet Take 1 tablet (6.25 mg total) by mouth 2 (  two) times daily with a meal. For Tachycardia  . DULoxetine (CYMBALTA) 30 MG capsule Take 1 capsule (30 mg total) by mouth at bedtime.  . folic acid (FOLVITE) 1 MG tablet Take 1 mg by mouth daily.  Marland Kitchen levothyroxine (SYNTHROID) 100 MCG tablet Take 1 tablet (100 mcg total) by mouth daily before breakfast.  . lisinopril (ZESTRIL) 40 MG tablet Take 1 tablet (40 mg total) by mouth daily.  Marland Kitchen LORazepam (ATIVAN) 2 MG tablet Take 1 tablet (2 mg total) by mouth at bedtime as needed for anxiety.  . Multiple Vitamins-Minerals (CENTRUM SILVER 50+WOMEN PO) Take 1 tablet by mouth daily.  . NON FORMULARY DIET :Regular NAS Heart healthy  .  omeprazole (PRILOSEC) 20 MG capsule Take 1 capsule (20 mg total) by mouth 2 (two) times daily before a meal.  . ranolazine (RANEXA) 500 MG 12 hr tablet Take 1 tablet (500 mg total) by mouth 2 (two) times daily.  . rosuvastatin (CRESTOR) 20 MG tablet Take 1 tablet (20 mg total) by mouth daily.  . SUMAtriptan (IMITREX) 25 MG tablet GIVE 1 TABLE AS NEEDED BY MOUTH WAITING 2 HOURS BETWEEN DOSES FOR MIGRAINE MAXIMUM 200 MG /DAY  . thiamine 100 MG tablet Take 100 mg by mouth daily.   No facility-administered encounter medications on file as of 05/05/2019.  OF  NOTE---LASIX 20 MG po QD and Potassium 10 Meq po QD have been added as of today  Review of Systems   General she is not complaining of any fever or chills.  Skin does not complain of rashes itching or diaphoresis.  Head ears eyes nose mouth and throat does not complain of visual changes or sore throat.  Respiratory does not complain today really of any shortness of breath or cough.  At times she will complain of some intermittent shortness of breath but this is not new.  Cardiac does not complain of chest pain has some mild lower extremity edema per nursing this has been baseline during her stay here.  GI is not complaining of abdominal pain nausea vomiting diarrhea constipation.  GU no complaints of dysuria.  Musculoskeletal still has some lower extremity weakness and would benefit from therapy does not complain of pain.  Neurologic does not complain of feeling dizzy or syncopal or having a headache does have a history of migraines.  And psych not complaining of being depressed or anxious appears to be looking forward to going home  Immunization History  Administered Date(s) Administered  . Influenza-Unspecified 09/22/2017  . Tdap 01/30/2018   Pertinent  Health Maintenance Due  Topic Date Due  . DEXA SCAN  Never done  . PNA vac Low Risk Adult (1 of 2 - PCV13) Never done  . INFLUENZA VACCINE  08/07/2019   No flowsheet data  found. Functional Status Survey:    Vitals:   05/05/19 1524  BP: 110/60  Pulse: 82  Resp: 18  Temp: 97.8 F (36.6 C)  TempSrc: Oral  SpO2: 91%  Weight: 142 lb 9.6 oz (64.7 kg)  Height: 5\' 2"  (1.575 m)   Body mass index is 26.08 kg/m. Physical Exam In general this is a pleasant elderly female in no distress.  Her skin is warm and dry.  Eyes visual acuity appears to be intact sclera and conjunctive are clear.  Oropharynx is clear mucous membranes moist.  Chest is clear to auscultation there is no labored breathing.  Heart today is largely regular rate and rhythm without murmur gallop or rub she has mild lower  extremity edema.  Abdomen is obese soft nontender with positive bowel sounds.  Musculoskeletal appears able to move all her extremities x4 at baseline she still has some lower extremity weakness.  Neurologic appears grossly intact cannot appreciate lateralizing findings her speech is clear.  Psych she is pleasant and appropriate Labs reviewed:  April 18 2019.  WBC 5.9 hemoglobin 10.6 platelets 335  Sodium 139 potassium 4.3 BUN 17.8 creatinine 1.0   Recent Labs    04/11/19 0000  NA 139  K 4.3  CL 99  CO2 34*  BUN 11  CREATININE 1.0  CALCIUM 8.9   Recent Labs    04/07/19 0000  AST 25  ALT 41*  ALKPHOS 140*  ALBUMIN 2.7*   Recent Labs    04/11/19 0000  WBC 7.7  HGB 11.2*  HCT 35*  PLT 399   No results found for: TSH No results found for: HGBA1C No results found for: CHOL, HDL, LDLCALC, LDLDIRECT, TRIG, CHOLHDL  Significant Diagnostic Results in last 30 days:  No results found.  Assessment/Plan  For complete assessment and plan upon discharge please see previous note May 04, 2019  #1 history of mild left pleural effusion left lung per x-ray report-with her history of CHF will start low-dose Lasix 20 mg a day along with 10 mEq of potassium-home health to draw an updated lab for follow-up by primary care provider-this was discussed  with Maudie Mercury the nurse at her primary care provider's office-they will follow-up.  She will need labs drawn by home health the primary care provider notified of results to keep an eye on her renal function and electrolytes.  She appears to be doing well today does not really complain of shortness of breath-she is looking forward to going home.  She does continue on Coreg 6.25 mg twice daily as well with her history of ventricular tachycardia as well as amiodarone 200 mg a day.  -she also is on Eliquis 5 mg twice daily for anticoagulation.   Her home health agency is [facility]-phone number is [phone].  The input from her primary care provider's office Dr. Charleston Poot (phone number [phone] ) has been greatly appreciated and we appreciate them following up-I did speak with Maudie Mercury the primary care provider's nurse who was most helpful.  I also called the pharmacy to add the Lasix and potassium to the meds that already had been transmitted to them for discharge  [phone]

## 2019-05-06 ENCOUNTER — Encounter: Payer: Self-pay | Admitting: Internal Medicine

## 2019-11-16 IMAGING — CT CT CERVICAL SPINE W/O CM
4 of 7 series · 12 of 33 positions shown, 13 images · non-contrast
Comparison: 01/30/2018

CLINICAL DATA: Status post fall.  Hit head on floor.

EXAM:
CT HEAD WITHOUT CONTRAST
CT CERVICAL SPINE WITHOUT CONTRAST
TECHNIQUE: Multidetector CT imaging of the head and cervical spine was
performed following the standard protocol without intravenous
contrast. Multiplanar CT image reconstructions of the cervical spine
were also generated.

[Series 7: c_spine 2.0 i30s 3 · axial · 0.35mm/px · z∈[-269,-231]mm · 2 of 77 slices shown]
[im 20/77  bone]
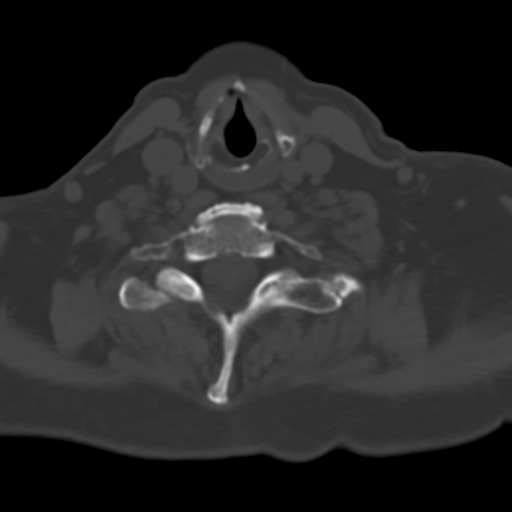
[im 39/77  bone]
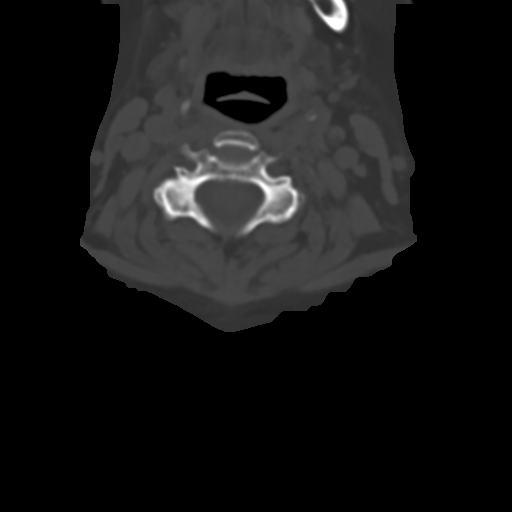

[Series 9: coronals · coronal · 0.20mm/px · 1 of 45 slices shown]
[im 23/45  bone]
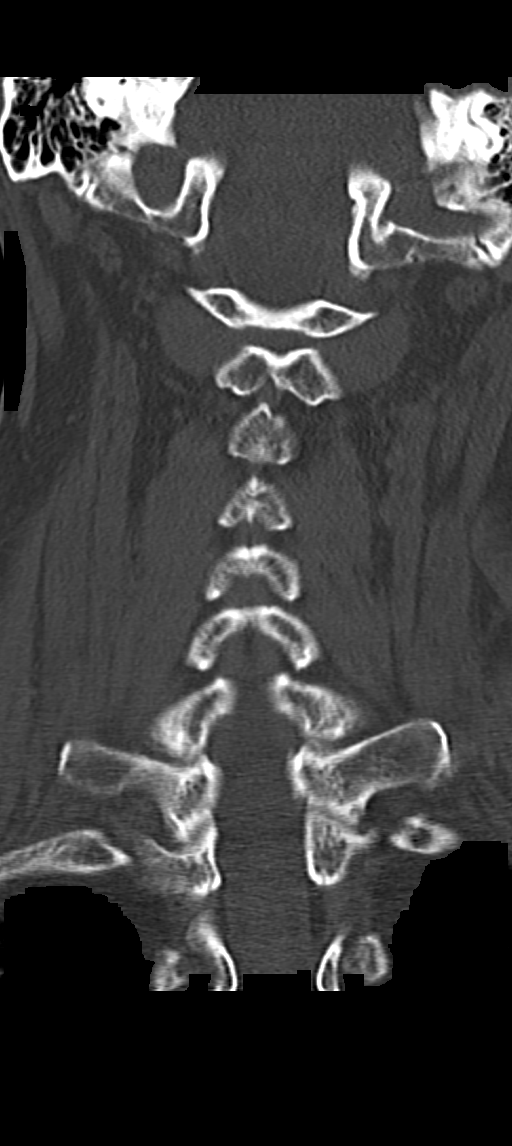

[Series 10: sagittals · sagittal · 0.22mm/px · 5 of 61 slices shown]
[im 11/61  bone]
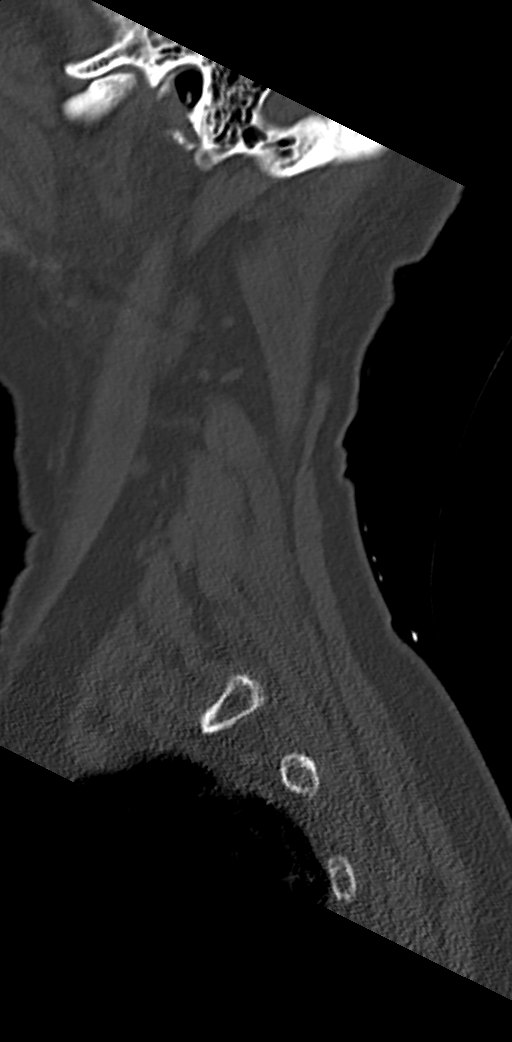
[im 21/61  bone]
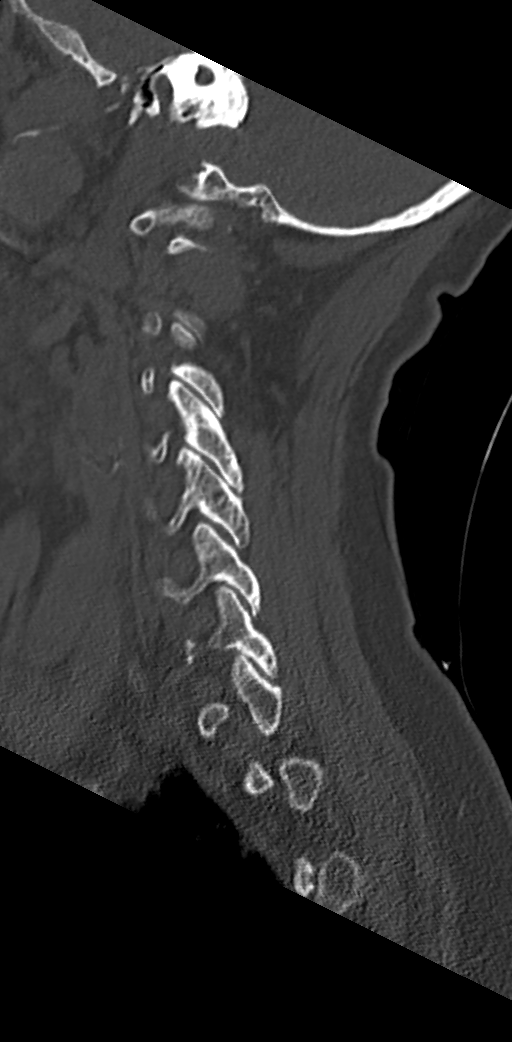
[im 31/61  bone]
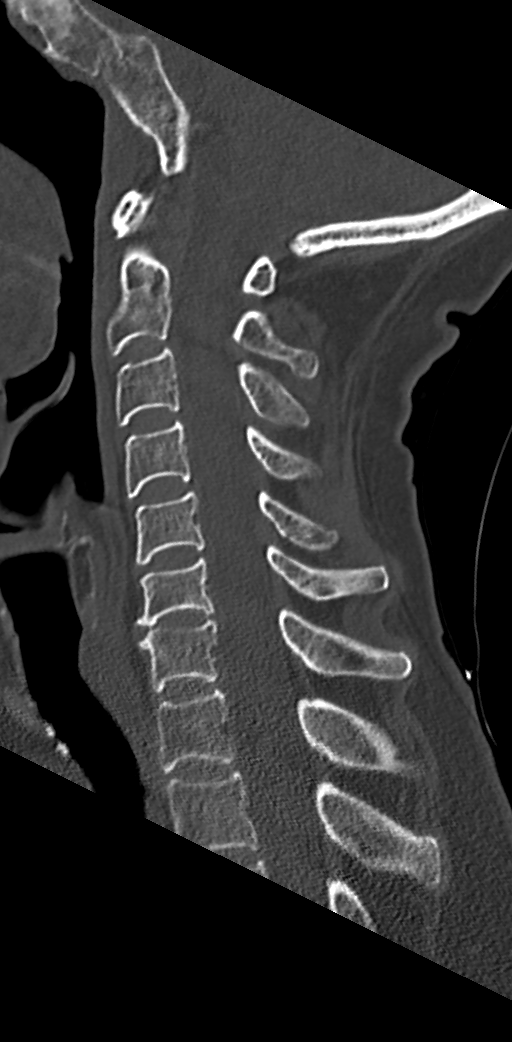
[im 41/61  bone]
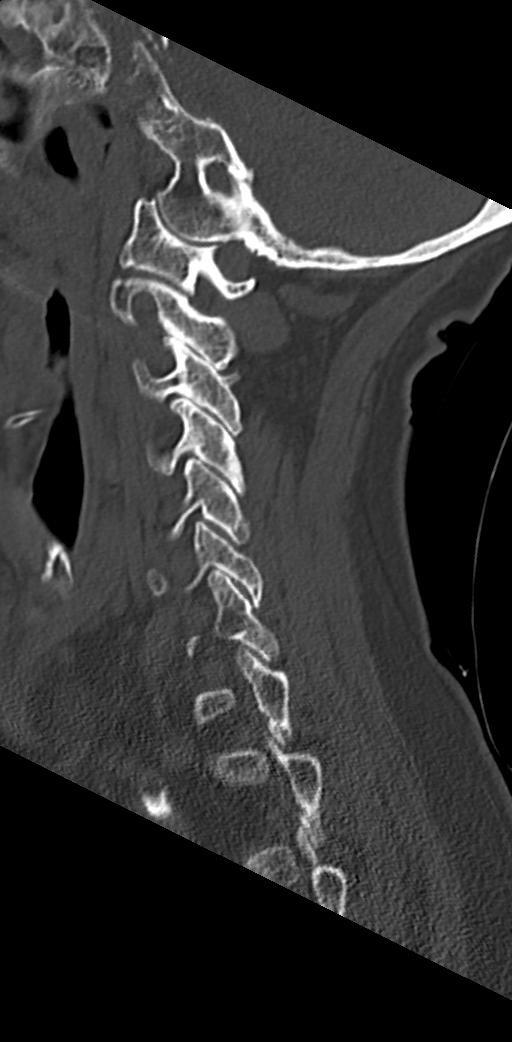
[im 51/61  bone]
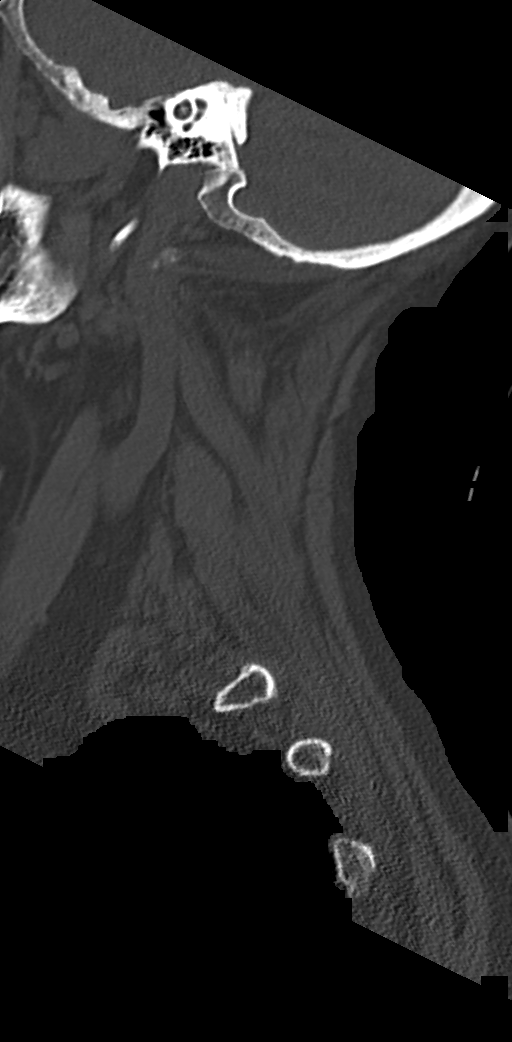

[Series 11: orthogonals · axial · 0.30mm/px · z∈[-332,-244]mm · 4 of 88 slices shown, 5 images]
[im 18/88  soft-tissue]
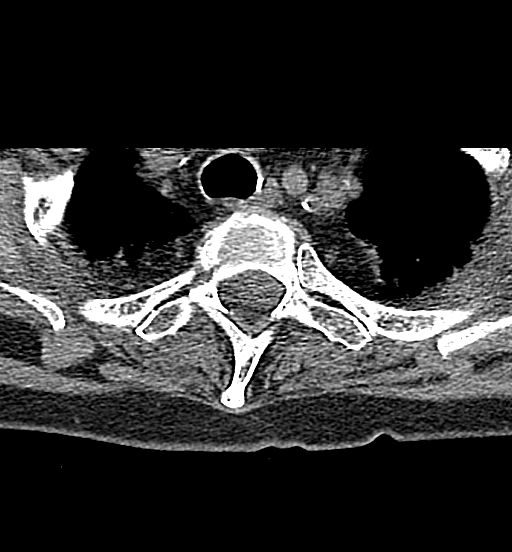
[im 18/88  bone]
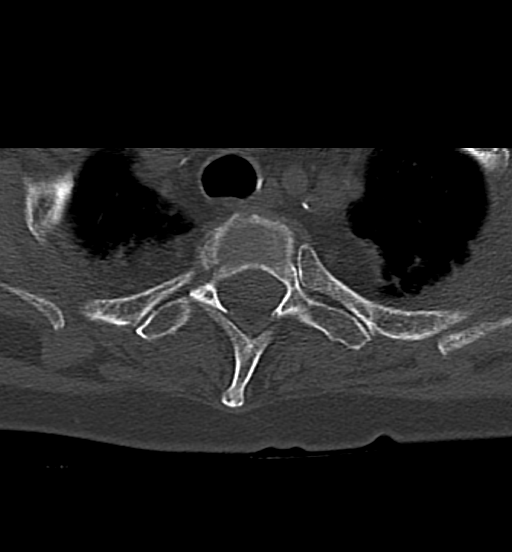
[im 35/88  bone]
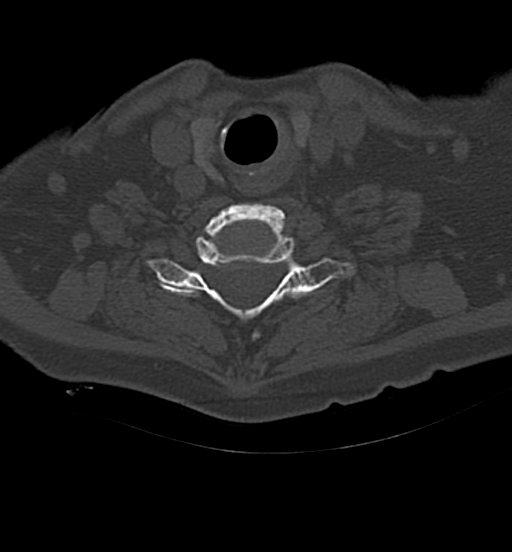
[im 53/88  bone]
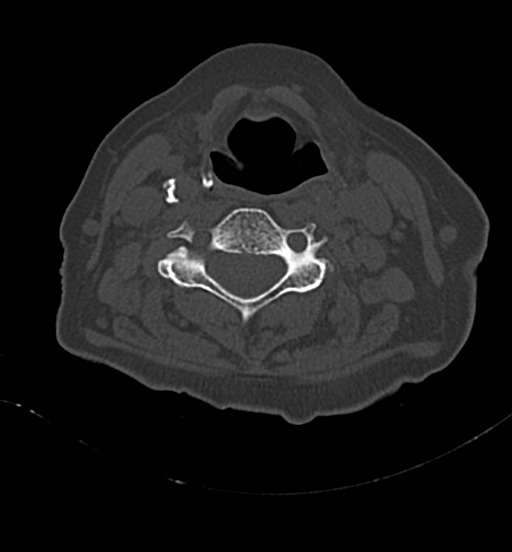
[im 70/88  bone]
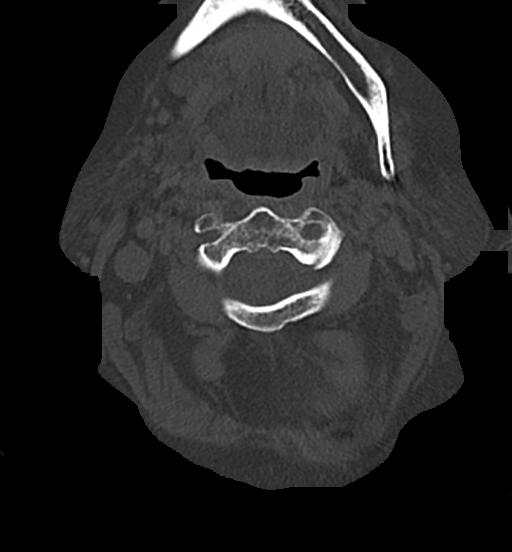

[12 of 33 positions shown; findings below may reference images not displayed]

FINDINGS: CT HEAD FINDINGS

Brain: No evidence of acute infarction, hemorrhage, hydrocephalus,
extra-axial collection or mass lesion/mass effect. Prominence of the
sulci and ventricles identified compatible with brain atrophy.

Vascular: No hyperdense vessel or unexpected calcification.

Skull: Normal. Negative for fracture or focal lesion.

Sinuses/Orbits: Postoperative changes from bilateral median
antrectomy. Moderate chronic bilateral mucoperiosteal thickening
involving the maxillary sinuses, sphenoid sinus, ethmoid air cells
and frontal sinus noted.

Other: None.

CT CERVICAL SPINE FINDINGS

Alignment: Normal.

Skull base and vertebrae: No acute fracture. No primary bone lesion
or focal pathologic process.

Soft tissues and spinal canal: No prevertebral fluid or swelling. No
visible canal hematoma.

Disc levels: Disc space narrowing and endplate spurring identified
at C6-7.

Upper chest: Negative.

Other: None
IMPRESSION: 1. No acute intracranial abnormalities.
2. Chronic small vessel ischemic disease and brain atrophy.
3. Advanced chronic sinus disease status post bilateral median
antrectomy.
4. No evidence for cervical spine fracture.

## 2020-08-06 DEATH — deceased
